# Patient Record
Sex: Male | Born: 1935 | Race: White | Hispanic: No | Marital: Married | Smoking: Former smoker
Health system: Southern US, Community
[De-identification: ages and names within clinical notes are randomized; demographics above are authoritative.]

## PROBLEM LIST (undated history)

## (undated) DIAGNOSIS — I10 Essential (primary) hypertension: Secondary | ICD-10-CM

## (undated) DIAGNOSIS — E119 Type 2 diabetes mellitus without complications: Secondary | ICD-10-CM

## (undated) DIAGNOSIS — J449 Chronic obstructive pulmonary disease, unspecified: Secondary | ICD-10-CM

---

## 2012-05-22 ENCOUNTER — Encounter (HOSPITAL_COMMUNITY): Payer: Self-pay | Admitting: Emergency Medicine

## 2012-05-22 ENCOUNTER — Inpatient Hospital Stay (HOSPITAL_COMMUNITY)
Admission: EM | Admit: 2012-05-22 | Discharge: 2012-05-31 | DRG: 871 | Disposition: A | Discharge status: Skilled Nursing Facility | Payer: Medicare Other | Attending: Internal Medicine | Admitting: Internal Medicine

## 2012-05-22 ENCOUNTER — Emergency Department (HOSPITAL_COMMUNITY): Payer: Medicare Other

## 2012-05-22 DIAGNOSIS — R238 Other skin changes: Secondary | ICD-10-CM | POA: Diagnosis not present

## 2012-05-22 DIAGNOSIS — J9819 Other pulmonary collapse: Secondary | ICD-10-CM | POA: Diagnosis present

## 2012-05-22 DIAGNOSIS — E785 Hyperlipidemia, unspecified: Secondary | ICD-10-CM | POA: Diagnosis present

## 2012-05-22 DIAGNOSIS — E872 Acidosis, unspecified: Secondary | ICD-10-CM | POA: Diagnosis present

## 2012-05-22 DIAGNOSIS — I872 Venous insufficiency (chronic) (peripheral): Secondary | ICD-10-CM | POA: Diagnosis present

## 2012-05-22 DIAGNOSIS — I440 Atrioventricular block, first degree: Secondary | ICD-10-CM | POA: Diagnosis present

## 2012-05-22 DIAGNOSIS — I442 Atrioventricular block, complete: Secondary | ICD-10-CM | POA: Diagnosis present

## 2012-05-22 DIAGNOSIS — N390 Urinary tract infection, site not specified: Secondary | ICD-10-CM | POA: Diagnosis present

## 2012-05-22 DIAGNOSIS — G4733 Obstructive sleep apnea (adult) (pediatric): Secondary | ICD-10-CM | POA: Diagnosis present

## 2012-05-22 DIAGNOSIS — I10 Essential (primary) hypertension: Secondary | ICD-10-CM | POA: Diagnosis present

## 2012-05-22 DIAGNOSIS — L0390 Cellulitis, unspecified: Secondary | ICD-10-CM

## 2012-05-22 DIAGNOSIS — L03119 Cellulitis of unspecified part of limb: Secondary | ICD-10-CM | POA: Diagnosis present

## 2012-05-22 DIAGNOSIS — N289 Disorder of kidney and ureter, unspecified: Secondary | ICD-10-CM

## 2012-05-22 DIAGNOSIS — Z87891 Personal history of nicotine dependence: Secondary | ICD-10-CM

## 2012-05-22 DIAGNOSIS — D72829 Elevated white blood cell count, unspecified: Secondary | ICD-10-CM | POA: Diagnosis present

## 2012-05-22 DIAGNOSIS — R0902 Hypoxemia: Secondary | ICD-10-CM

## 2012-05-22 DIAGNOSIS — G8929 Other chronic pain: Secondary | ICD-10-CM | POA: Diagnosis present

## 2012-05-22 DIAGNOSIS — I878 Other specified disorders of veins: Secondary | ICD-10-CM | POA: Diagnosis present

## 2012-05-22 DIAGNOSIS — N179 Acute kidney failure, unspecified: Secondary | ICD-10-CM | POA: Diagnosis present

## 2012-05-22 DIAGNOSIS — J9600 Acute respiratory failure, unspecified whether with hypoxia or hypercapnia: Secondary | ICD-10-CM | POA: Diagnosis present

## 2012-05-22 DIAGNOSIS — Z6841 Body Mass Index (BMI) 40.0 and over, adult: Secondary | ICD-10-CM

## 2012-05-22 DIAGNOSIS — E1165 Type 2 diabetes mellitus with hyperglycemia: Secondary | ICD-10-CM

## 2012-05-22 DIAGNOSIS — IMO0001 Reserved for inherently not codable concepts without codable children: Secondary | ICD-10-CM | POA: Diagnosis present

## 2012-05-22 DIAGNOSIS — N170 Acute kidney failure with tubular necrosis: Secondary | ICD-10-CM | POA: Diagnosis present

## 2012-05-22 DIAGNOSIS — J9601 Acute respiratory failure with hypoxia: Secondary | ICD-10-CM | POA: Diagnosis present

## 2012-05-22 DIAGNOSIS — I119 Hypertensive heart disease without heart failure: Secondary | ICD-10-CM | POA: Diagnosis present

## 2012-05-22 DIAGNOSIS — L02419 Cutaneous abscess of limb, unspecified: Secondary | ICD-10-CM | POA: Diagnosis present

## 2012-05-22 DIAGNOSIS — A419 Sepsis, unspecified organism: Principal | ICD-10-CM | POA: Diagnosis present

## 2012-05-22 DIAGNOSIS — Z79899 Other long term (current) drug therapy: Secondary | ICD-10-CM

## 2012-05-22 DIAGNOSIS — J189 Pneumonia, unspecified organism: Secondary | ICD-10-CM | POA: Diagnosis present

## 2012-05-22 DIAGNOSIS — IMO0002 Reserved for concepts with insufficient information to code with codable children: Secondary | ICD-10-CM | POA: Diagnosis present

## 2012-05-22 HISTORY — DX: Type 2 diabetes mellitus without complications: E11.9

## 2012-05-22 HISTORY — DX: Essential (primary) hypertension: I10

## 2012-05-22 HISTORY — DX: Chronic obstructive pulmonary disease, unspecified: J44.9

## 2012-05-22 LAB — URINALYSIS, ROUTINE W REFLEX MICROSCOPIC
Glucose, UA: NEGATIVE mg/dL
Nitrite: NEGATIVE
Protein, ur: 30 mg/dL — AB
Urobilinogen, UA: 1 mg/dL (ref 0.0–1.0)

## 2012-05-22 LAB — CBC WITH DIFFERENTIAL/PLATELET
Basophils Absolute: 0 10*3/uL (ref 0.0–0.1)
Basophils Relative: 0 % (ref 0–1)
Eosinophils Absolute: 0 10*3/uL (ref 0.0–0.7)
Hemoglobin: 12.4 g/dL — ABNORMAL LOW (ref 13.0–17.0)
Lymphocytes Relative: 6 % — ABNORMAL LOW (ref 12–46)
MCH: 31.8 pg (ref 26.0–34.0)
MCHC: 34.9 g/dL (ref 30.0–36.0)
Monocytes Absolute: 1.8 10*3/uL — ABNORMAL HIGH (ref 0.1–1.0)
Neutrophils Relative %: 88 % — ABNORMAL HIGH (ref 43–77)
Platelets: 350 10*3/uL (ref 150–400)
WBC Morphology: INCREASED

## 2012-05-22 LAB — POCT I-STAT, CHEM 8
BUN: 40 mg/dL — ABNORMAL HIGH (ref 6–23)
Calcium, Ion: 1 mmol/L — ABNORMAL LOW (ref 1.13–1.30)
HCT: 38 % — ABNORMAL LOW (ref 39.0–52.0)
Hemoglobin: 12.9 g/dL — ABNORMAL LOW (ref 13.0–17.0)
Sodium: 136 mEq/L (ref 135–145)
TCO2: 22 mmol/L (ref 0–100)

## 2012-05-22 LAB — URINE MICROSCOPIC-ADD ON

## 2012-05-22 LAB — LACTIC ACID, PLASMA: Lactic Acid, Venous: 4.2 mmol/L — ABNORMAL HIGH (ref 0.5–2.2)

## 2012-05-22 LAB — TROPONIN I: Troponin I: <0.3 ng/mL (ref <0.30)

## 2012-05-22 LAB — PROTIME-INR: Prothrombin Time: 15.1 seconds (ref 11.6–15.2)

## 2012-05-22 MED ORDER — ALBUTEROL SULFATE (5 MG/ML) 0.5% IN NEBU
5.0000 mg | INHALATION_SOLUTION | Freq: Once | RESPIRATORY_TRACT | Status: AC
  Administered 2012-05-22: 5 mg via RESPIRATORY_TRACT
  Filled 2012-05-22: qty 1

## 2012-05-22 MED ORDER — HEPARIN (PORCINE) IN NACL 100-0.45 UNIT/ML-% IJ SOLN
2450.0000 [IU]/h | INTRAMUSCULAR | Status: DC
  Administered 2012-05-22: 1750 [IU]/h via INTRAVENOUS
  Administered 2012-05-23 (×2): 2450 [IU]/h via INTRAVENOUS
  Administered 2012-05-23: 1750 [IU]/h via INTRAVENOUS
  Filled 2012-05-22 (×4): qty 250

## 2012-05-22 MED ORDER — WHITE PETROLATUM GEL
Status: AC
  Administered 2012-05-22: 1
  Filled 2012-05-22: qty 5

## 2012-05-22 MED ORDER — HYDROMORPHONE HCL PF 1 MG/ML IJ SOLN
1.0000 mg | Freq: Once | INTRAMUSCULAR | Status: AC
  Administered 2012-05-22: 1 mg via INTRAVENOUS
  Filled 2012-05-22: qty 1

## 2012-05-22 MED ORDER — PIPERACILLIN-TAZOBACTAM 3.375 G IVPB
3.3750 g | Freq: Once | INTRAVENOUS | Status: AC
  Administered 2012-05-22: 3.375 g via INTRAVENOUS
  Filled 2012-05-22: qty 50

## 2012-05-22 MED ORDER — FENTANYL CITRATE 0.05 MG/ML IJ SOLN
50.0000 ug | Freq: Once | INTRAMUSCULAR | Status: AC
  Administered 2012-05-22: 50 ug via INTRAVENOUS
  Filled 2012-05-22: qty 2

## 2012-05-22 MED ORDER — VANCOMYCIN HCL IN DEXTROSE 1-5 GM/200ML-% IV SOLN
1000.0000 mg | Freq: Once | INTRAVENOUS | Status: AC
  Administered 2012-05-22: 1000 mg via INTRAVENOUS
  Filled 2012-05-22: qty 200

## 2012-05-22 MED ORDER — SODIUM CHLORIDE 0.9 % IV SOLN
INTRAVENOUS | Status: AC
  Administered 2012-05-22: 23:00:00 via INTRAVENOUS

## 2012-05-22 MED ORDER — HEPARIN BOLUS VIA INFUSION
3200.0000 [IU] | Freq: Once | INTRAVENOUS | Status: AC
  Administered 2012-05-22: 3200 [IU] via INTRAVENOUS

## 2012-05-22 MED ORDER — SODIUM CHLORIDE 0.9 % IV BOLUS (SEPSIS)
500.0000 mL | Freq: Once | INTRAVENOUS | Status: AC
  Administered 2012-05-22: 500 mL via INTRAVENOUS

## 2012-05-22 NOTE — ED Notes (Addendum)
EMS reported patient has experienced sob, weakness for 2 weeks, nocturnal dypnea,,+orthopnea, fever, cyanotic on scene, skin hot & diaphoretic. Treated with oxygen.  Now morbid obese patient skin pale, hot & dry. Tachypnea with sightless exertion. Lower extremities left leg is leathery,  red, hot with ulcer noted. Right foot is cool.

## 2012-05-22 NOTE — ED Notes (Signed)
MD at bedside. 

## 2012-05-22 NOTE — ED Provider Notes (Signed)
History     CSN: [id number]  Arrival date & time 05/22/12  1827   First MD Initiated Contact with Patient 05/22/12 1833      Chief Complaint  Patient presents with  . Shortness of Breath    (Consider location/radiation/quality/duration/timing/severity/associated sxs/prior treatment) Patient is a 78 y.o. male presenting with shortness of breath. The history is provided by the patient.  Shortness of Breath  Associated symptoms include a fever and shortness of breath. Pertinent negatives include no chest pain.   patient has shortness of breath for the last week. He states that he has had difficulty walking. He's had cough without much sputum production. He has had chills with fevers. He states his pain is rear-ended from sitting too long. He has swelling in his left lower leg. He states it is chronically abnormal but is more red now than previously. No dysuria. No nausea vomiting or diarrhea.  History reviewed. No pertinent past medical history.  History reviewed. No pertinent past surgical history.  History reviewed. No pertinent family history.  History  Substance Use Topics  . Smoking status: Not on file  . Smokeless tobacco: Not on file  . Alcohol Use: Not on file      Review of Systems  Constitutional: Positive for fever and fatigue. Negative for activity change and appetite change.  HENT: Negative for neck stiffness.   Eyes: Negative for pain.  Respiratory: Positive for chest tightness and shortness of breath.   Cardiovascular: Negative for chest pain and leg swelling.  Gastrointestinal: Negative for nausea, vomiting, abdominal pain and diarrhea.  Genitourinary: Negative for flank pain.  Musculoskeletal: Negative for back pain.  Skin: Positive for color change. Negative for rash.  Neurological: Negative for weakness, numbness and headaches.  Psychiatric/Behavioral: Negative for behavioral problems.    Allergies  Alka-seltzer heartburn; Citric acid; Potassium  bicarbonate; and Sodium bicarbonate  Home Medications   Current Outpatient Rx  Name  Route  Sig  Dispense  Refill  . ATORVASTATIN CALCIUM 40 MG PO TABS   Oral   Take 40 mg by mouth daily.         Marland Kitchen GLIPIZIDE ER 10 MG PO TB24   Oral   Take 10 mg by mouth 2 (two) times daily.         Marland Kitchen HYDROCHLOROTHIAZIDE 25 MG PO TABS   Oral   Take 25 mg by mouth daily.         Marland Kitchen LISINOPRIL 20 MG PO TABS   Oral   Take 20 mg by mouth daily.         . MELOXICAM 15 MG PO TABS   Oral   Take 15 mg by mouth daily.         Marland Kitchen METFORMIN HCL 1000 MG PO TABS   Oral   Take 1,000 mg by mouth 2 (two) times daily with a meal.         . METOPROLOL TARTRATE 50 MG PO TABS   Oral   Take 25 mg by mouth 2 (two) times daily.         . ADULT MULTIVITAMIN W/MINERALS CH   Oral   Take 1 tablet by mouth daily.         . OXYCODONE-ACETAMINOPHEN 10-325 MG PO TABS   Oral   Take 1 tablet by mouth every 4 (four) hours as needed. pain         . POTASSIUM CHLORIDE CRYS ER 10 MEQ PO TBCR   Oral   Take  10 mEq by mouth 2 (two) times daily.         Marland Kitchen VITAMIN E 400 UNITS PO CAPS   Oral   Take 400 Units by mouth daily.           BP 139/34  Pulse 57  Temp 98.2 F (36.8 C) (Oral)  Resp 23  Ht 5\' 9"  (1.753 m)  Wt 300 lb (136.079 kg)  BMI 44.30 kg/m2  SpO2 98%  Physical Exam  Nursing note and vitals reviewed. Constitutional: He is oriented to person, place, and time. He appears well-developed and well-nourished.  HENT:  Head: Normocephalic and atraumatic.  Eyes: EOM are normal. Pupils are equal, round, and reactive to light.  Neck: Normal range of motion. Neck supple.  Cardiovascular: Normal rate, regular rhythm and normal heart sounds.   No murmur heard. Pulmonary/Chest: Effort normal.       Tachypnea. No Rales or wheezing  Abdominal: Soft. Bowel sounds are normal. He exhibits no distension and no mass. There is no tenderness. There is no rebound and no guarding.       Patient is  obese  Musculoskeletal: Normal range of motion. He exhibits edema.       Left lower leg has area of chronic venous insufficiency is now rather more red and warmer than the contralateral.  Neurological: He is alert and oriented to person, place, and time. No cranial nerve deficit.  Skin: Skin is warm and dry.  Psychiatric: He has a normal mood and affect.    ED Course  Procedures (including critical care time)  Labs Reviewed  CBC WITH DIFFERENTIAL - Abnormal; Notable for the following:    WBC 30.2 (*)     RBC 3.90 (*)     Hemoglobin 12.4 (*)     HCT 35.5 (*)     Neutrophils Relative 88 (*)     Lymphocytes Relative 6 (*)     Neutro Abs 26.6 (*)     Monocytes Absolute 1.8 (*)     All other components within normal limits  POCT I-STAT, CHEM 8 - Abnormal; Notable for the following:    BUN 40 (*)     Creatinine, Ser 1.90 (*)     Glucose, Bld 232 (*)     Calcium, Ion 1.00 (*)     Hemoglobin 12.9 (*)     HCT 38.0 (*)     All other components within normal limits  URINALYSIS, ROUTINE W REFLEX MICROSCOPIC - Abnormal; Notable for the following:    Color, Urine AMBER (*)  BIOCHEMICALS MAY BE AFFECTED BY COLOR   APPearance CLOUDY (*)     Hgb urine dipstick LARGE (*)     Bilirubin Urine SMALL (*)     Ketones, ur TRACE (*)     Protein, ur 30 (*)     Leukocytes, UA SMALL (*)     All other components within normal limits  LACTIC ACID, PLASMA - Abnormal; Notable for the following:    Lactic Acid, Venous 4.2 (*)     All other components within normal limits  URINE MICROSCOPIC-ADD ON - Abnormal; Notable for the following:    Bacteria, UA MANY (*)     Casts GRANULAR CAST (*)     All other components within normal limits  TROPONIN I  PROTIME-INR  APTT  CULTURE, BLOOD (ROUTINE X 2)  CULTURE, BLOOD (ROUTINE X 2)  HEPARIN LEVEL (UNFRACTIONATED)  CBC  URINE CULTURE   Dg Chest Port 1 View  05/22/2012  *  RADIOLOGY REPORT*  Clinical Data: Mid chest pain, shortness of breath, hypertension,  diabetes  PORTABLE CHEST - 1 VIEW  Comparison: Portable exam 1918 hours compared to 11/17/2008  Findings: Exclusion of costophrenic angles especially on the left. Upper normal heart size. Mediastinal contours and pulmonary vascularity normal. Question minimal right basilar atelectasis. Upper lungs clear. No gross pleural effusion or pneumothorax.  IMPRESSION: No definite acute abnormalities though the costophrenic angles particularly on the left are partially excluded. Consider follow-up upright PA and lateral chest radiographs if clinically indicated to better evaluate the lung bases.   Original Report Authenticated By: Ulyses Southward, M.D.      1. Urosepsis   2. Complete heart block   3. Hypoxia   4. Renal insufficiency   5. Cellulitis      Date: 05/22/2012  Rate: 62  Rhythm: complete heart block  QRS Axis: normal  Intervals: normal  ST/T Wave abnormalities: nonspecific ST/T changes  Conduction Disutrbances:right bundle branch block  Narrative Interpretation:   Old EKG Reviewed: none available   Date: 05/22/2012  Rate: 59  Rhythm: 2:1 AV block  QRS Axis: normal  Intervals: normal  ST/T Wave abnormalities: normal  Conduction Disutrbances:right bundle branch block  Narrative Interpretation: complete block is now 2:1  Old EKG Reviewed: changes noted  CRITICAL CARE Performed by: Billee Cashing   Total critical care time: 35  Critical care time was exclusive of separately billable procedures and treating other patients.  Critical care was necessary to treat or prevent imminent or life-threatening deterioration.  Critical care was time spent personally by me on the following activities: development of treatment plan with patient and/or surrogate as well as nursing, discussions with consultants, evaluation of patient's response to treatment, examination of patient, obtaining history from patient or surrogate, ordering and performing treatments and interventions, ordering and  review of laboratory studies, ordering and review of radiographic studies, pulse oximetry and re-evaluation of patient's condition.  MDM  Patient presents with generalized weakness shortness of breath. He is hypoxic on room air with left lower extremity redness and erythema. X-ray does not show pneumonia. At this point I am treating him as a presumed pulmonary embolism. I cannot get a CT angio at this time do to renal insufficiency. He was started on heparin empirically. Patient was also found to have complete heart block and then later 2:1 AV block. No known history of this. After discussion with cardiology the patient will be transferred to [facility] cone.  and the patient's fever and was found to have urinary tract infection. He was started on Zosyn and vancomycin to cover urine and cellulitis. His lactic acid is elevated, however his blood pressure has been overall maintained. He'll be admitted to the ICU at [facility].      Juliet Rude. Rubin Payor, MD 05/22/12 [phone]

## 2012-05-22 NOTE — ED Notes (Signed)
Attempted to give report. Receiving RN busy. 

## 2012-05-22 NOTE — ED Notes (Signed)
EKG printed and given to EDP Pickering for review

## 2012-05-22 NOTE — ED Notes (Signed)
EDP at bedside  

## 2012-05-22 NOTE — Progress Notes (Signed)
ANTICOAGULATION CONSULT NOTE - Initial Consult  Pharmacy Consult for Heparin Indication: pulmonary embolus  Allergies  Allergen Reactions  . Alka-Seltzer Heartburn (Sodium Bicarbonate-Citric Acid)   . Citric Acid     All the ingredients in alka seltzer gold  . Potassium Bicarbonate     All the ingredients in alka seltzer gold  . Sodium Bicarbonate     All the ingredients in alka seltzer gold    Patient Measurements: Height: 5\' 9"  (175.3 cm) Weight: 300 lb (136.079 kg) IBW/kg (Calculated) : 70.7  Heparin Dosing Weight: 102  Vital Signs: Temp: 100.8 F (38.2 C) (11/17 1935) Temp src: Oral (11/17 1935) BP: 145/35 mmHg (11/17 2000) Pulse Rate: 71  (11/17 2000)  Labs:  Basename 05/22/12 2033 05/22/12 1948 05/22/12 1933  HGB -- 12.9* 12.4*  HCT -- 38.0* 35.5*  PLT -- -- 350  APTT 35 -- --  LABPROT 15.1 -- --  INR 1.21 -- --  HEPARINUNFRC -- -- --  CREATININE -- 1.90* --  CKTOTAL -- -- --  CKMB -- -- --  TROPONINI -- -- <0.30    Estimated Creatinine Clearance: 43.2 ml/min (by C-G formula based on Cr of 1.9).   Medical History: History reviewed. No pertinent past medical history.  Medications:  Scheduled:    . [COMPLETED] albuterol  5 mg Nebulization Once  . [COMPLETED] fentaNYL  50 mcg Intravenous Once  .  HYDROmorphone (DILAUDID) injection  1 mg Intravenous Once  . vancomycin  1,000 mg Intravenous Once   Infusions:   PRN:   Assessment: 81 YOM admitted 11/17 w/ sob. Pharmacy to dose Heparin for PE Baseline anticoagulation and CBC labs reviewed.    Goal of Therapy:  Heparin level 0.3-0.7 units/ml Monitor platelets by anticoagulation protocol: Yes   Plan:  Heparin Bolus 3200 units Heparin infusion 1750 units/hr Heparin level 8 hr after start of infusion Daily HL and CBC  Gwen Her PharmD  [phone] 05/22/2012 9:19 PM

## 2012-05-22 NOTE — ED Notes (Signed)
OZH:[id number]<HQ> Expected date:<BR> Expected time:<BR> Means of arrival:<BR> Comments:<BR> SOB

## 2012-05-23 ENCOUNTER — Inpatient Hospital Stay (HOSPITAL_COMMUNITY): Payer: Medicare Other

## 2012-05-23 DIAGNOSIS — R0902 Hypoxemia: Secondary | ICD-10-CM

## 2012-05-23 DIAGNOSIS — L0291 Cutaneous abscess, unspecified: Secondary | ICD-10-CM

## 2012-05-23 DIAGNOSIS — I442 Atrioventricular block, complete: Secondary | ICD-10-CM

## 2012-05-23 DIAGNOSIS — A419 Sepsis, unspecified organism: Secondary | ICD-10-CM

## 2012-05-23 DIAGNOSIS — N390 Urinary tract infection, site not specified: Secondary | ICD-10-CM

## 2012-05-23 DIAGNOSIS — N179 Acute kidney failure, unspecified: Secondary | ICD-10-CM

## 2012-05-23 DIAGNOSIS — I441 Atrioventricular block, second degree: Secondary | ICD-10-CM

## 2012-05-23 DIAGNOSIS — R0602 Shortness of breath: Secondary | ICD-10-CM

## 2012-05-23 DIAGNOSIS — M7989 Other specified soft tissue disorders: Secondary | ICD-10-CM

## 2012-05-23 LAB — GLUCOSE, CAPILLARY
Glucose-Capillary: 243 mg/dL — ABNORMAL HIGH (ref 70–99)
Glucose-Capillary: 220 mg/dL — ABNORMAL HIGH (ref 70–99)

## 2012-05-23 LAB — BASIC METABOLIC PANEL
Chloride: 98 mEq/L (ref 96–112)
GFR calc Af Amer: 29 mL/min — ABNORMAL LOW (ref >90)
GFR calc non Af Amer: 25 mL/min — ABNORMAL LOW (ref >90)
Glucose, Bld: 225 mg/dL — ABNORMAL HIGH (ref 70–99)
Potassium: 3.6 mEq/L (ref 3.5–5.1)
Sodium: 136 mEq/L (ref 135–145)

## 2012-05-23 LAB — MRSA PCR SCREENING: MRSA by PCR: NEGATIVE

## 2012-05-23 LAB — POCT I-STAT 3, ART BLOOD GAS (G3+)
Acid-base deficit: 3 mmol/L — ABNORMAL HIGH (ref 0.0–2.0)
Bicarbonate: 21.5 mEq/L (ref 20.0–24.0)
O2 Saturation: 96 %
TCO2: 23 mmol/L (ref 0–100)

## 2012-05-23 LAB — HEPATIC FUNCTION PANEL
Bilirubin, Direct: 0.2 mg/dL (ref 0.0–0.3)
Indirect Bilirubin: 0.2 mg/dL — ABNORMAL LOW (ref 0.3–0.9)
Total Bilirubin: 0.4 mg/dL (ref 0.3–1.2)

## 2012-05-23 LAB — HEMOGLOBIN A1C
Hgb A1c MFr Bld: 6.7 % — ABNORMAL HIGH (ref <5.7)
Mean Plasma Glucose: 146 mg/dL — ABNORMAL HIGH (ref <117)

## 2012-05-23 LAB — HEPARIN LEVEL (UNFRACTIONATED)
Heparin Unfractionated: <0.1 IU/mL — ABNORMAL LOW (ref 0.30–0.70)
Heparin Unfractionated: 0.18 IU/mL — ABNORMAL LOW (ref 0.30–0.70)

## 2012-05-23 LAB — TSH: TSH: 0.525 u[IU]/mL (ref 0.350–4.500)

## 2012-05-23 LAB — CBC
MCH: 30.8 pg (ref 26.0–34.0)
Platelets: 295 10*3/uL (ref 150–400)
RBC: 3.64 MIL/uL — ABNORMAL LOW (ref 4.22–5.81)

## 2012-05-23 MED ORDER — SODIUM CHLORIDE 0.9 % IV SOLN
250.0000 mL | INTRAVENOUS | Status: DC | PRN
  Administered 2012-05-29: 250 mL via INTRAVENOUS

## 2012-05-23 MED ORDER — INFLUENZA VIRUS VACC SPLIT PF IM SUSP
0.5000 mL | INTRAMUSCULAR | Status: AC
  Filled 2012-05-23: qty 0.5

## 2012-05-23 MED ORDER — FAMOTIDINE IN NACL 20-0.9 MG/50ML-% IV SOLN
20.0000 mg | Freq: Every day | INTRAVENOUS | Status: DC
  Administered 2012-05-23 – 2012-05-25 (×4): 20 mg via INTRAVENOUS
  Filled 2012-05-23 (×7): qty 50

## 2012-05-23 MED ORDER — PIPERACILLIN-TAZOBACTAM 3.375 G IVPB
3.3750 g | Freq: Three times a day (TID) | INTRAVENOUS | Status: DC
  Administered 2012-05-23 – 2012-05-25 (×7): 3.375 g via INTRAVENOUS
  Filled 2012-05-23 (×8): qty 50

## 2012-05-23 MED ORDER — SODIUM CHLORIDE 0.9 % IV SOLN: INTRAVENOUS | Status: DC

## 2012-05-23 MED ORDER — HEPARIN SODIUM (PORCINE) 5000 UNIT/ML IJ SOLN
5000.0000 [IU] | Freq: Three times a day (TID) | INTRAMUSCULAR | Status: DC
  Administered 2012-05-24 – 2012-05-31 (×21): 5000 [IU] via SUBCUTANEOUS
  Filled 2012-05-23 (×28): qty 1

## 2012-05-23 MED ORDER — PERFLUTREN LIPID MICROSPHERE
INTRAVENOUS | Status: AC
  Administered 2012-05-23: 1.5 mL
  Filled 2012-05-23: qty 10

## 2012-05-23 MED ORDER — IPRATROPIUM BROMIDE 0.02 % IN SOLN: 0.5000 mg | RESPIRATORY_TRACT | Status: DC | PRN

## 2012-05-23 MED ORDER — INSULIN ASPART 100 UNIT/ML ~~LOC~~ SOLN
2.0000 [IU] | SUBCUTANEOUS | Status: DC
  Administered 2012-05-23 (×2): 4 [IU] via SUBCUTANEOUS
  Administered 2012-05-23: 6 [IU] via SUBCUTANEOUS
  Administered 2012-05-23 – 2012-05-24 (×4): 2 [IU] via SUBCUTANEOUS
  Administered 2012-05-24: 4 [IU] via SUBCUTANEOUS
  Administered 2012-05-24: 2 [IU] via SUBCUTANEOUS

## 2012-05-23 MED ORDER — VANCOMYCIN HCL 1000 MG IV SOLR
1250.0000 mg | INTRAVENOUS | Status: DC
  Administered 2012-05-23: 1250 mg via INTRAVENOUS
  Filled 2012-05-23: qty 1250

## 2012-05-23 MED ORDER — ALBUTEROL SULFATE (5 MG/ML) 0.5% IN NEBU: 2.5000 mg | INHALATION_SOLUTION | RESPIRATORY_TRACT | Status: DC | PRN

## 2012-05-23 MED ORDER — PNEUMOCOCCAL VAC POLYVALENT 25 MCG/0.5ML IJ INJ
0.5000 mL | INJECTION | INTRAMUSCULAR | Status: AC
  Filled 2012-05-23: qty 0.5

## 2012-05-23 MED ORDER — SODIUM CHLORIDE 0.9 % IV BOLUS (SEPSIS)
1000.0000 mL | Freq: Once | INTRAVENOUS | Status: AC
  Administered 2012-05-23: 1000 mL via INTRAVENOUS

## 2012-05-23 MED ORDER — ACETAMINOPHEN 650 MG RE SUPP
325.0000 mg | RECTAL | Status: DC | PRN
  Administered 2012-05-23: 325 mg via RECTAL
  Filled 2012-05-23: qty 1

## 2012-05-23 MED ORDER — ALBUTEROL SULFATE HFA 108 (90 BASE) MCG/ACT IN AERS
4.0000 | INHALATION_SPRAY | RESPIRATORY_TRACT | Status: DC | PRN
  Administered 2012-05-23: 4 via RESPIRATORY_TRACT
  Filled 2012-05-23: qty 6.7

## 2012-05-23 MED ORDER — HEPARIN BOLUS VIA INFUSION
3000.0000 [IU] | Freq: Once | INTRAVENOUS | Status: AC
  Administered 2012-05-23: 3000 [IU] via INTRAVENOUS
  Filled 2012-05-23: qty 3000

## 2012-05-23 MED ORDER — VANCOMYCIN HCL 1000 MG IV SOLR
1500.0000 mg | INTRAVENOUS | Status: DC
  Administered 2012-05-24 – 2012-05-26 (×3): 1500 mg via INTRAVENOUS
  Filled 2012-05-23 (×3): qty 1500

## 2012-05-23 NOTE — ED Notes (Signed)
Pt denied chest pain. Pt stated pain is mostly in buttocks from sitting too long on the stretcher.

## 2012-05-23 NOTE — H&P (Signed)
Name: Luis Carrillo MRN: [id number] DOB: 12/18/1930    LOS: 1  PULMONARY / CRITICAL CARE MEDICINE  HPI:  78 years old morbid obese male with PMH relevant for DM, HTN, dyslipidemia and chronic pain on narcotics. Presents with two weeks of worsening SOB, productive cough and more recently fever and chills. Over the last couple of days developed LLE erythema and swelling. Does not recall trauma but there is a healing wound in the LLE. At the time of my exam awake, alert, oriented x3 and hemodynamically stable. At admission found to be briefly on complete heart block but now 2:1 AV block. Cardiology was consulted. UA found consistent with UTI. Denies N/V/D, urinary symptoms. No CP.  Current Status:   Vital Signs: Temp:  [97.6 F (36.4 C)-100.8 F (38.2 C)] 97.6 F (36.4 C) (11/18 0805) Pulse Rate:  [41-74] 69  (11/18 0700) Resp:  [17-30] 28  (11/18 0700) BP: (102-156)/(20-115) 106/66 mmHg (11/18 0700) SpO2:  [87 %-99 %] 94 % (11/18 0700) Weight:  [136.079 kg (300 lb)-148.1 kg (326 lb 8 oz)] 148.1 kg (326 lb 8 oz) (11/18 0230)  Physical Examination: General:  Pleasant male patient in mild respiratory distress Neuro:  Awake, alert, oriented x 3, nonfocal HEENT:  PERRL, pink conjunctivae, moist membranes Neck:  Supple, no JVD   Cardiovascular:  RRR, no M/R/G Lungs:  Bilateral diminished air entry, no W/R/R Abdomen:  Soft, nontender, nondistended, bowel sounds present Musculoskeletal:  Moves all extremities, pedal edema present Skin:  LLE erythema and increased temperature, 1 cm superficial wound healing.  ASSESSMENT AND PLAN 1) Sepsis 2) Possible pneumonia 3) LLE cellulitis, will rule out DVT. 4) UTI 5) Brief period of complete heart block, now 2:1 AV block 6) HTN 7) DM 8) Dyslipidemia 9) Morbid obesity.  PULMONARY No results found for this basename: PHART:5,PCO2:5,PCO2ART:5,PO2ART:5,HCO3:5,O2SAT:5 in the last 168 hours Ventilator Settings:   CXR:  Blurring of the left  hemidiaphragm suggestive of LLL pneumonia.  A:  1) Possible LLL pneumonia P:   1) Will treat with Zosyn and Vancomycin  CARDIOVASCULAR  Lab 05/23/12 0505 05/22/12 2033 05/22/12 1933  TROPONINI -- -- <0.30  LATICACIDVEN 2.4* 4.2* --  PROBNP -- -- --   ECG:  Last EKG on NSR, RBBB Lines: Peripheral lines  A:  1) Sepsis 2) Lactic acidosis 3) Brief episode of complete heart block and 2:1 AV block 4) LLE cellulitis and edema P:  1) KVO IVF and hold lasix given renal function. 2) Antibiotics as above. 3) We will hold antihypertensive medications. 4) LE venous doppler to rule out DVT pending. 5) Will continue heparin drip for now.  RENAL  Lab 05/23/12 0505 05/22/12 1948  NA 136 136  K 3.6 4.4  CL 98 103  CO2 20 --  BUN 36* 40*  CREATININE 2.34* 1.90*  CALCIUM 8.0* --  MG -- --  PHOS -- --   Intake/Output      11/17 0701 - 11/18 0700 11/18 0701 - 11/19 0700   P.O. 360    I.V. (mL/kg) 390 (2.6) 22.5 (0.2)   IV Piggyback 1000 178.5   Total Intake(mL/kg) 1750 (11.8) 201 (1.4)   Urine (mL/kg/hr) 200 (0.1) 50   Total Output 200 50   Net +1550 +151        Urine Occurrence 1 x      Intake/Output Summary (Last 24 hours) at 05/23/12 0927 Last data filed at 05/23/12 0737  Gross per 24 hour  Intake   1951 ml  Output  250 ml  Net   1701 ml   Foley:  05/22/12  A:   1) Acute renal failure with BUN:Cr dropping with hydration but Cr rising. P:   1) KVO IVF. 2) Monitor chemistry in am. 3) Continue to hold lasix for now.  GASTROINTESTINAL No results found for this basename: AST:5,ALT:5,ALKPHOS:5,BILITOT:5,PROT:5,ALBUMIN:5 in the last 168 hours  A:   1) No issues P:   - GI prophylaxis with pepcid  HEMATOLOGIC  Lab 05/23/12 0505 05/22/12 2033 05/22/12 1948 05/22/12 1933  HGB 11.2* -- 12.9* 12.4*  HCT 33.1* -- 38.0* 35.5*  PLT 295 -- -- 350  INR -- 1.21 -- --  APTT -- 35 -- --   A:   1) No issues  INFECTIOUS  Lab 05/23/12 0505 05/22/12 1933  WBC 28.8*  30.2*  PROCALCITON -- --   Cultures: Blood, s[putum and urine cultures ordered. Antibiotics: Zosyn and Vancomycin 05/22/12  A:   1) Sepsis - Possible pneumonia - LLE cellulitis - UTI P:   - Antibiotics as above, will narrow once Cx results  ENDOCRINE  Lab 05/23/12 0807 05/23/12 0327 05/23/12 0115  GLUCAP 189* 220* 243*   A:   1) DM P:   - Insulin sliding scale  NEUROLOGIC  A:   1) No issues  BEST PRACTICE / DISPOSITION - Level of Care: ICU - Primary Service:  PCCM - Consultants:  Cardiology - Code Status:  Full code - Diet:  Clear liquids - DVT Px:  Heparin drip - GI Px:  Pepcid - Skin Integrity:  LLE cellulitis - Social / Family:  Family updated at bedside.  Rapidly developing respiratory failure, IVF overloaded and PNA in addition to body habitus all contributing, concern for intubation is that we will not be able to get him off the ventilator.  Will attempt BiPAP first.  The patient is critically ill with multiple organ systems failure and requires high complexity decision making for assessment and support, frequent evaluation and titration of therapies, application of advanced monitoring technologies and extensive interpretation of multiple databases.   Critical Care Time devoted to patient care services described in this note is: 35 min.  Overton Mam, M.D. Pulmonary and Critical Care Medicine [facility] Pager: [phone]  05/23/2012, 9:12 AM

## 2012-05-23 NOTE — H&P (Signed)
Name: Luis Carrillo MRN: [id number] DOB: Mar 06, 1931    LOS: 1  PULMONARY / CRITICAL CARE MEDICINE  HPI:  78 years old morbid obese male with PMH relevant for DM, HTN, dyslipidemia and chronic pain on narcotics. Presents with two weeks of worsening SOB, productive cough and more recently fever and chills. Over the last couple of days developed LLE erythema and swelling. Does not recall trauma but there is a healing wound in the LLE. At the time of my exam awake, alert, oriented x3 and hemodynamically stable. At admission found to be briefly on complete heart block but now 2:1 AV block. Cardiology was consulted. UA found consistent with UTI. Denies N/V/D, urinary symptoms. No CP.  PMH: 1) HTN 2) Dyslipidemia 3) DM 4) Obesity  Prior to Admission medications   Medication Sig Start Date End Date Taking? Authorizing Provider  atorvastatin (LIPITOR) 40 MG tablet Take 40 mg by mouth daily.   Yes Historical Provider, MD  glipiZIDE (GLUCOTROL XL) 10 MG 24 hr tablet Take 10 mg by mouth 2 (two) times daily.   Yes Historical Provider, MD  hydrochlorothiazide (HYDRODIURIL) 25 MG tablet Take 25 mg by mouth daily.   Yes Historical Provider, MD  lisinopril (PRINIVIL,ZESTRIL) 20 MG tablet Take 20 mg by mouth daily.   Yes Historical Provider, MD  meloxicam (MOBIC) 15 MG tablet Take 15 mg by mouth daily.   Yes Historical Provider, MD  metFORMIN (GLUCOPHAGE) 1000 MG tablet Take 1,000 mg by mouth 2 (two) times daily with a meal.   Yes Historical Provider, MD  metoprolol (LOPRESSOR) 50 MG tablet Take 25 mg by mouth 2 (two) times daily.   Yes Historical Provider, MD  Multiple Vitamin (MULTIVITAMIN WITH MINERALS) TABS Take 1 tablet by mouth daily.   Yes Historical Provider, MD  oxyCODONE-acetaminophen (PERCOCET) 10-325 MG per tablet Take 1 tablet by mouth every 4 (four) hours as needed. pain   Yes Historical Provider, MD  potassium chloride (K-DUR,KLOR-CON) 10 MEQ tablet Take 10 mEq by mouth 2 (two) times daily.   Yes  Historical Provider, MD  vitamin E 400 UNIT capsule Take 400 Units by mouth daily.   Yes Historical Provider, MD   Allergies Allergies  Allergen Reactions  . Alka-Seltzer Heartburn (Sodium Bicarbonate-Citric Acid)   . Citric Acid     All the ingredients in alka seltzer gold  . Potassium Bicarbonate     All the ingredients in alka seltzer gold  . Sodium Bicarbonate     All the ingredients in alka seltzer gold    Family History History reviewed. No pertinent family history. Social History Former smoker of 1 pack per day for 30 years. Quit in 1980. No history of drugs or alcohol abuse.  Review Of Systems:  All systems reviewed and found negative except for what I mentioned in the HPI.   Current Status:  Vital Signs: Temp:  [98.2 F (36.8 C)-100.8 F (38.2 C)] 100.4 F (38 C) (11/18 0230) Pulse Rate:  [41-71] 52  (11/18 0330) Resp:  [17-30] 30  (11/18 0330) BP: (102-156)/(28-84) 124/34 mmHg (11/18 0330) SpO2:  [87 %-99 %] 94 % (11/18 0330) Weight:  [300 lb (136.079 kg)-326 lb 8 oz (148.1 kg)] 326 lb 8 oz (148.1 kg) (11/18 0230)  Physical Examination: General:  Pleasant male patient in mild respiratory distress Neuro:  Awake, alert, oriented x 3, nonfocal HEENT:  PERRL, pink conjunctivae, moist membranes Neck:  Supple, no JVD   Cardiovascular:  RRR, no M/R/G Lungs:  Bilateral diminished air entry,  no W/R/R Abdomen:  Soft, nontender, nondistended, bowel sounds present Musculoskeletal:  Moves all extremities, pedal edema present Skin:  LLE erythema and increased temperature, 1 cm superficial wound healing.    ASSESSMENT AND PLAN 1) Sepsis 2) Possible pneumonia 3) LLE cellulitis, will rule out DVT. 4) UTI 5) Brief period of complete heart block, now 2:1 AV block 6) HTN 7) DM 8) Dyslipidemia 9) Morbid obesity.  PULMONARY No results found for this basename: PHART:5,PCO2:5,PCO2ART:5,PO2ART:5,HCO3:5,O2SAT:5 in the last 168 hours Ventilator Settings:   CXR:   Blurring of the left hemidiaphragm suggestive of LLL pneumonia.  A:  1) Possible LLL pneumonia P:   1) Will treat with Zosyn and Vancomycin  CARDIOVASCULAR  Lab 05/22/12 2033 05/22/12 1933  TROPONINI -- <0.30  LATICACIDVEN 4.2* --  PROBNP -- --   ECG:  Last EKG on NSR, RBBB Lines: Peripheral lines  A:  1) Sepsis 2) Lactic acidosis 3) Brief episode of complete heart block and 2:1 AV block 4) LLE cellulitis and edema P:  1) Will continue IVF resuscitation 2) Antibiotics as above 3) We will hold antihypertensive medications 4) Will get LE venous doppler to rule out DVT 5) Will continue heparin drip for now.  RENAL  Lab 05/22/12 1948  NA 136  K 4.4  CL 103  CO2 --  BUN 40*  CREATININE 1.90*  CALCIUM --  MG --  PHOS --   Intake/Output      11/17 0701 - 11/18 0700   P.O. 240   I.V. (mL/kg) 55 (0.4)   IV Piggyback 1000   Total Intake(mL/kg) 1295 (8.7)   Urine (mL/kg/hr) 100 (0)   Total Output 100   Net +1195        Foley:  05/22/12  A:   1) Acute renal failure, likely pre renal P:   1) Will continue IVF's 2) Monitor chemistry in am  GASTROINTESTINAL No results found for this basename: AST:5,ALT:5,ALKPHOS:5,BILITOT:5,PROT:5,ALBUMIN:5 in the last 168 hours  A:   1) No issues P:   - GI prophylaxis with pepcid  HEMATOLOGIC  Lab 05/22/12 2033 05/22/12 1948 05/22/12 1933  HGB -- 12.9* 12.4*  HCT -- 38.0* 35.5*  PLT -- -- 350  INR 1.21 -- --  APTT 35 -- --   A:   1) No issues   INFECTIOUS  Lab 05/22/12 1933  WBC 30.2*  PROCALCITON --   Cultures: Blood, s[putum and urine cultures ordered. Antibiotics: Zosyn and Vancomycin 05/22/12  A:   1) Sepsis - Possible pneumonia - LLE cellulitis - UTI P:   - Antibiotics as above.  ENDOCRINE  Lab 05/23/12 0115  GLUCAP 243*   A:   1) DM P:   - Insulin sliding scale  NEUROLOGIC  A:   1) No issues   BEST PRACTICE / DISPOSITION - Level of Care: ICU - Primary Service:  PCCM -  Consultants:  Cardiology - Code Status:  Full code - Diet:  Clear liquids - DVT Px:  Heparin drip - GI Px:  Pepcid - Skin Integrity:  LLE cellulitis - Social / Family:  Family updated at bedside.  The patient is critically ill with multiple organ systems failure and requires high complexity decision making for assessment and support, frequent evaluation and titration of therapies, application of advanced monitoring technologies and extensive interpretation of multiple databases.   Critical Care Time devoted to patient care services described in this note is: 1 Hour  Overton Mam, M.D. Pulmonary and Critical Care Medicine  HealthCare Pager: [phone])  [phone]  05/23/2012, 3:59 AM

## 2012-05-23 NOTE — Care Management Note (Signed)
    Page 1 of 1   05/23/2012     12:07:34 PM   CARE MANAGEMENT NOTE 05/23/2012  Patient:  STEVAN, BAKKEN   Account Number:  [id number]  Date Initiated:  05/23/2012  Documentation initiated by:  Junius Creamer  Subjective/Objective Assessment:   adm w pneumonia     Action/Plan:   lives w wife   Anticipated DC Date:     Anticipated DC Plan:        DC Planning Services  CM consult      Choice offered to / List presented to:             Status of service:   Medicare Important Message given?   (If response is "NO", the following Medicare IM given date fields will be blank) Date Medicare IM given:   Date Additional Medicare IM given:    Discharge Disposition:    Per UR Regulation:  Reviewed for med. necessity/level of care/duration of stay  If discussed at Long Length of Stay Meetings, dates discussed:    Comments:  11/18 12n debbie Latronda Spink rn,bsn [phone]

## 2012-05-23 NOTE — Progress Notes (Signed)
ANTICOAGULATION CONSULT NOTE  Pharmacy Consult for Heparin Indication: R/O PE/DVT  Allergies  Allergen Reactions  . Alka-Seltzer Heartburn (Sodium Bicarbonate-Citric Acid)   . Citric Acid     All the ingredients in alka seltzer gold  . Potassium Bicarbonate     All the ingredients in alka seltzer gold  . Sodium Bicarbonate     All the ingredients in alka seltzer gold    Patient Measurements: Height: 5\' 9"  (175.3 cm) Weight: 326 lb 8 oz (148.1 kg) IBW/kg (Calculated) : 70.7  Heparin Dosing Weight: 102  Vital Signs: Temp: 100.4 F (38 C) (11/18 0230) Temp src: Oral (11/18 0230) BP: 127/115 mmHg (11/18 0530) Pulse Rate: 73  (11/18 0530)  Labs:  Basename 05/23/12 0505 05/22/12 2033 05/22/12 1948 05/22/12 1933  HGB 11.2* -- 12.9* --  HCT 33.1* -- 38.0* 35.5*  PLT 295 -- -- 350  APTT -- 35 -- --  LABPROT -- 15.1 -- --  INR -- 1.21 -- --  HEPARINUNFRC <0.10* -- -- --  CREATININE 2.34* -- 1.90* --  CKTOTAL -- -- -- --  CKMB -- -- -- --  TROPONINI -- -- -- <0.30    Estimated Creatinine Clearance: 36.8 ml/min (by C-G formula based on Cr of 2.34).  Assessment: 78 yo male with possible DVT/PE for Heparin  Goal of Therapy:  Heparin level 0.3-0.7 units/ml Monitor platelets by anticoagulation protocol: Yes   Plan:  Heparin 3000 units IV bolus, then increase heparin 2250 units/hr Check heparin level in 6 hours.  Geannie Risen, PharmD, BCPS 05/23/2012 6:50 AM

## 2012-05-23 NOTE — Progress Notes (Signed)
eLink Physician-Brief Progress Note Patient Name: Luis Carrillo DOB: [birth year] MRN: [id number]  Date of Service  05/23/2012   HPI/Events of Note  Fever 102; patient with cellulitis, UTI and possible PNA; ARF    eICU Interventions  Tylenol for fever, cont zosyn and vanc; renal US; urine legionella and pneumococcus antig      Etter Royall 05/23/2012, 4:55 PM

## 2012-05-23 NOTE — Progress Notes (Addendum)
ANTIBIOTIC CONSULT NOTE - INITIAL  Pharmacy Consult for vancomycin, Zosyn Indication: rule out sepsis  Allergies  Allergen Reactions  . Alka-Seltzer Heartburn (Sodium Bicarbonate-Citric Acid)   . Citric Acid     All the ingredients in alka seltzer gold  . Potassium Bicarbonate     All the ingredients in alka seltzer gold  . Sodium Bicarbonate     All the ingredients in alka seltzer gold    Patient Measurements: Height: 5\' 9"  (175.3 cm) Weight: 300 lb (136.079 kg) IBW/kg (Calculated) : 70.7   Vital Signs: Temp: 98.2 F (36.8 C) (11/17 2237) Temp src: Oral (11/17 2237) BP: 156/43 mmHg (11/18 0134) Pulse Rate: 55  (11/18 0134) Intake/Output from previous day: 11/17 0701 - 11/18 0700 In: -  Out: 100 [Urine:100] Intake/Output from this shift: Total I/O In: -  Out: 100 [Urine:100]  Labs:  [facility] 05/22/12 1948 05/22/12 1933  WBC -- 30.2*  HGB 12.9* 12.4*  PLT -- 350  LABCREA -- --  CREATININE 1.90* --   Estimated Creatinine Clearance: 43.2 ml/min (by C-G formula based on Cr of 1.9). No results found for this basename: VANCOTROUGH:2,VANCOPEAK:2,VANCORANDOM:2,GENTTROUGH:2,GENTPEAK:2,GENTRANDOM:2,TOBRATROUGH:2,TOBRAPEAK:2,TOBRARND:2,AMIKACINPEAK:2,AMIKACINTROU:2,AMIKACIN:2, in the last 72 hours   Microbiology: No results found for this or any previous visit (from the past 720 hour(s)).  Medical History: History reviewed. No pertinent past medical history.  Medications:  Scheduled:    . sodium chloride   Intravenous STAT  . [COMPLETED] albuterol  5 mg Nebulization Once  . famotidine (PEPCID) IV  20 mg Intravenous Q12H  . [COMPLETED] fentaNYL  50 mcg Intravenous Once  . [COMPLETED] heparin  3,200 Units Intravenous Once  . [COMPLETED]  HYDROmorphone (DILAUDID) injection  1 mg Intravenous Once  . piperacillin-tazobactam (ZOSYN)  IV  3.375 g Intravenous Once  . [COMPLETED] sodium chloride  500 mL Intravenous Once  . [COMPLETED] vancomycin  1,000 mg Intravenous Once   . [COMPLETED] white petrolatum       Assessment: 78 yo male transferred from [facility] with generalized weakness and shortness of breath. Patient was started on heparin for possible PE (no CT angio at this time due to renal insufficiency). Pharmacy now consulted to manage vancomycin and Zosyn for possible sepsis / pneumonia / cellulitis. Patient has already received vancomycin 1gm x 1 and Zosyn 3.375gm IV x 1.   Goal of Therapy:  Vancomycin trough 10-20 mcg/mL   Plan:  1. Zosyn 3.375gm IV Q8H (4 hr infusion) 2. Vancomycin 1.25gm IV Q24H.   Reynolds, Mellody Drown 05/23/2012,2:38 AM  Based on patients weight and renal function, will increase dose of vancomycin to 1500mg  IV q24h, will monitor renal function closely, and get steady state levels as soon as possible

## 2012-05-23 NOTE — Consult Note (Signed)
Reason for Consult: AV Block 2:1 and brief period of Complete Heart Block Referring Physician: Dr. Gwenyth Bouillon Luis Carrillo is an 78 y.o. male.  HPI: 78 yo man with Hypertension, dyslipidemia, T2DM, chronic pain on narcotics (oxycontin and oxycodone), rocky [place] spotted fever 25 years ago, bilateral knee pain, family history only of a brother with MI who comes in with cough, shortness of breath and left leg cellulitis. He tells me he is mainly limited by his bilateral knee pain and takes 1 oxycodone or oxycontin every 6 hours for pain. He's noted that coughing and SOB have been more bothersome over the last several days with eruption of left leg warmth/erythema and swelling. He just started noticing some chills/night sweats and fevers (today). He otherwise does not have current chest pain, no lightheadedness or syncope. He gets around ok at home except for knee pain.   History reviewed. No pertinent past medical history.  History reviewed. No pertinent past surgical history.  History reviewed. No pertinent family history.  Social History:  does not have a smoking history on file. He does not have any smokeless tobacco history on file. His alcohol and drug histories not on file. No tobacco/etoh or drugs  Allergies:  Allergies  Allergen Reactions  . Alka-Seltzer Heartburn (Sodium Bicarbonate-Citric Acid)   . Citric Acid     All the ingredients in alka seltzer gold  . Potassium Bicarbonate     All the ingredients in alka seltzer gold  . Sodium Bicarbonate     All the ingredients in alka seltzer gold    Medications:  I have reviewed the patient's current medications. Prior to Admission:  Prescriptions prior to admission  Medication Sig Dispense Refill  . atorvastatin (LIPITOR) 40 MG tablet Take 40 mg by mouth daily.      Marland Kitchen glipiZIDE (GLUCOTROL XL) 10 MG 24 hr tablet Take 10 mg by mouth 2 (two) times daily.      . hydrochlorothiazide (HYDRODIURIL) 25 MG tablet Take 25 mg by mouth daily.       Marland Kitchen lisinopril (PRINIVIL,ZESTRIL) 20 MG tablet Take 20 mg by mouth daily.      . meloxicam (MOBIC) 15 MG tablet Take 15 mg by mouth daily.      . metFORMIN (GLUCOPHAGE) 1000 MG tablet Take 1,000 mg by mouth 2 (two) times daily with a meal.      . metoprolol (LOPRESSOR) 50 MG tablet Take 25 mg by mouth 2 (two) times daily.      . Multiple Vitamin (MULTIVITAMIN WITH MINERALS) TABS Take 1 tablet by mouth daily.      Marland Kitchen oxyCODONE-acetaminophen (PERCOCET) 10-325 MG per tablet Take 1 tablet by mouth every 4 (four) hours as needed. pain      . potassium chloride (K-DUR,KLOR-CON) 10 MEQ tablet Take 10 mEq by mouth 2 (two) times daily.      . vitamin E 400 UNIT capsule Take 400 Units by mouth daily.      Review of Systems  Constitutional: Positive for fever, chills and malaise/fatigue.  HENT: Negative for ear pain and tinnitus.   Eyes: Negative for blurred vision, double vision and photophobia.  Respiratory: Positive for cough and shortness of breath. Negative for hemoptysis and sputum production.   Cardiovascular: Positive for leg swelling. Negative for chest pain, palpitations and orthopnea.  Gastrointestinal: Negative for nausea, vomiting and abdominal pain.  Genitourinary: Negative for dysuria and urgency.  Musculoskeletal: Positive for back pain and joint pain.  Skin: Positive for rash.  Neurological: Negative  for dizziness, tingling, sensory change and headaches.  Endo/Heme/Allergies: Negative for environmental allergies. Does not bruise/bleed easily.  Psychiatric/Behavioral: Negative for depression, suicidal ideas and substance abuse.   Blood pressure 156/43, pulse 55, temperature 98.2 F (36.8 C), temperature source Oral, resp. rate 23, height 5\' 9"  (1.753 m), weight 136.079 kg (300 lb), SpO2 90.00%. Physical Exam  Nursing note and vitals reviewed. Constitutional: He is oriented to person, place, and time. He appears well-developed and well-nourished. No distress.  HENT:  Nose: Nose  normal.  Mouth/Throat: Oropharynx is clear and moist. No oropharyngeal exudate.  Eyes: Conjunctivae normal and EOM are normal. Pupils are equal, round, and reactive to light. No scleral icterus.  Neck: Normal range of motion. Neck supple. No tracheal deviation present. No thyromegaly present.       JVD difficult to assess but appears 2 cm above clavicle at 60 degrees  Cardiovascular: Normal rate, regular rhythm and intact distal pulses.  Exam reveals no gallop.   Murmur heard.      Soft systolic murmur at LSB  Respiratory: Effort normal. No respiratory distress. He has no wheezes.       Slightly decreased BS; improved superior aeration  GI: Soft. Bowel sounds are normal. He exhibits no distension. There is no tenderness. There is no rebound.  Musculoskeletal: Normal range of motion. He exhibits edema and tenderness.  Neurological: He is alert and oriented to person, place, and time. No cranial nerve deficit. Coordination normal.       Left leg with warmth, erythema, swelling  Skin: Skin is warm. Rash noted. He is not diaphoretic. There is erythema.  Psychiatric: He has a normal mood and affect. His behavior is normal.   Chest x-ray: no acute process, ? Smaller lung fields EKGs reviewed: RBBB, one with poor baseline but what looks like AV dissociation/complete heart block, two subsequent EKGs with what appears to be 2:1 AV Block Labs reviewed; wbc 30k, h/h 12.4/35.5, plt 350, troponin negative, blood culture x2 drawn, urine with many bacteria/wbc Problem List Leukoytosis/Cellulitis/Urosepsis Shortness of breath Pyuria/bacteruria Elevated Lactate 2:1 AV Block with brief period of Complete Heart Block Heparinization for possible/presumed PE given SOB, left leg swelling, immobile status Acute vs. 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 Given age and comorbidities, he has significant reasons for conduction disease; however, he appears asymptomatic and he has significant infection at this time so permanent pacemaker placement if warranted will need to be deferred if at all possible for several days to clear the infection. Will give metoprolol time to wash-out and follow ECGs and telemetry while treating what appears to be a urosepsis picture.  - obtain complete echocardiogram to assess LV function and evaluate for structural abnormalities - on broad spectrum antibiotics for infectious clinical picture - hold all AV nodal agents (metoprolol he is on at home) - on heparin for possible/presumed PE  Luis Carrillo 05/23/2012, 3:31 AM

## 2012-05-23 NOTE — Progress Notes (Signed)
  Echocardiogram 2D Echocardiogram has been performed.  Luis Carrillo 05/23/2012, 2:24 PM

## 2012-05-23 NOTE — Progress Notes (Signed)
Patient: Luis Carrillo Date of Encounter: 05/23/2012, 7:18 AM Admit date: 05/22/2012     Subjective  Patient denies chest pain, chest pressure, dizziness or lightheadedness. Still c/o SOB managed by PCCM.   Objective  Physical Exam: Vitals: BP 127/115  Pulse 73  Temp 100.4 F (38 C) (Oral)  Resp 28  Ht 5\' 9"  (1.753 m)  Wt 326 lb 8 oz (148.1 kg)  BMI 48.22 kg/m2  SpO2 91%  General: Pleasant male patient in mild respiratory distress  Neuro: Awake, alert, oriented x 3, nonfocal  HEENT: PERRL, pink conjunctivae, moist membranes  Neck: Supple, unable to assess JVD due to body habitus Cardiovascular: RRR, no M/R/G  Lungs: Bilateral diminished air entry, no W/R/R  Abdomen: Soft, nontender, nondistended, bowel sounds present  Musculoskeletal: Moves all extremities, pedal edema present  Skin: LLE erythema and increased temperature, 1 cm superficial wound healing   Intake/Output:  Intake/Output Summary (Last 24 hours) at 05/23/12 0718 Last data filed at 05/23/12 0500  Gross per 24 hour  Intake   1750 ml  Output    200 ml  Net   1550 ml    Inpatient Medications:     . sodium chloride   Intravenous STAT  . [COMPLETED] albuterol  5 mg Nebulization Once  . famotidine (PEPCID) IV  20 mg Intravenous QHS  . [COMPLETED] fentaNYL  50 mcg Intravenous Once  . [COMPLETED] heparin  3,000 Units Intravenous Once  . [COMPLETED] heparin  3,200 Units Intravenous Once  . [COMPLETED]  HYDROmorphone (DILAUDID) injection  1 mg Intravenous Once  . influenza  inactive virus vaccine  0.5 mL Intramuscular Tomorrow-1000  . insulin aspart  2-6 Units Subcutaneous Q4H  . [COMPLETED] piperacillin-tazobactam (ZOSYN)  IV  3.375 g Intravenous Once  . piperacillin-tazobactam (ZOSYN)  IV  3.375 g Intravenous Q8H  . pneumococcal 23 valent vaccine  0.5 mL Intramuscular Tomorrow-1000  . [COMPLETED] sodium chloride  1,000 mL Intravenous Once  . [COMPLETED] sodium chloride  500 mL Intravenous Once  .  vancomycin  1,250 mg Intravenous Q24H  . [COMPLETED] vancomycin  1,000 mg Intravenous Once  . [COMPLETED] white petrolatum          . sodium chloride 1,000 mL (05/23/12 0245)  . heparin 1,750 Units/hr (05/23/12 0554)    Labs:  [facility] 05/23/12 0505 05/22/12 1948  NA 136 136  K 3.6 4.4  CL 98 103  CO2 20 --  GLUCOSE 225* 232*  BUN 36* 40*  CREATININE 2.34* 1.90*  CALCIUM 8.0* --  MG -- --  PHOS -- --    Basename 05/23/12 0505 05/22/12 1948 05/22/12 1933  WBC 28.8* -- 30.2*  NEUTROABS -- -- 26.6*  HGB 11.2* 12.9* --  HCT 33.1* 38.0* --  MCV 90.9 -- 91.0  PLT 295 -- 350    Basename 05/22/12 1933  CKTOTAL --  CKMB --  TROPONINI <0.30    Radiology/Studies: Dg Chest Port 1 View  05/22/2012  *RADIOLOGY REPORT*  Clinical Data: Mid chest pain, shortness of breath, hypertension, diabetes  PORTABLE CHEST - 1 VIEW  Comparison: Portable exam 1918 hours compared to 11/17/2008  Findings: Exclusion of costophrenic angles especially on the left. Upper normal heart size. Mediastinal contours and pulmonary vascularity normal. Question minimal right basilar atelectasis. Upper lungs clear. No gross pleural effusion or pneumothorax.  IMPRESSION: No definite acute abnormalities though the costophrenic angles particularly on the left are partially excluded. Consider follow-up upright PA and lateral chest radiographs if clinically indicated to better  evaluate the lung bases.   Original Report Authenticated By: Ulyses Southward, M.D.     Echocardiogram:Pending Carotid Doppler: Pending Telemetry:    Assessment and Plan  78 years old morbid obese male with PMH of DM, HTN, HLD, RMSF, family history only of a brother with MI  Presents with two weeks of worsening SOB, productive cough and more recently fever and chills. Over the last couple of days developed LLE erythema and swelling. At admission found to be briefly on complete heart block but now 2:1 AV block.  1. Brief period of complete heart  block, now 2:1 AV block     - patient is asymptomatic    - Hold AV nodal agents ( on Metoprolol at home)    - Follow EKG    - avoid PPM in the setting of significant infections.     - Echo today    - on Heparin gtt for ? DVT or presumed PE  2. Sepsis- Panculture, Vanc and Zosyn, PCCM   3. LLL pneumonia, PCCM  4. LLE cellulitis- LE doppler to rule out DVT. On Heparin drip   5. UTI, see above  6. Acute on chronic rena failure, per PCCM  7. HTN, hold home meds (HCTZ, Lisinopril, Glipizide,Metoprolol)  8. DM, SSI, consider adding Lantus. Per PCCM  9. Dyslipidemia   10. Morbid obesity  11. Code status:Full code  12. VTE: heparin drip     Signed, LI, NA PGY-2 7:19 AM  Attending Note:   The patient was seen and examined.  Agree with assessment and plan as noted above.  I have made changes where appropriate in the above H&P.  Heart block / 2:1 -  Will allow the Metoprolol to wash out.  Check TSH.  His hypoxemia may be contributing to his heart block.  He will need aggressive treatment of his pulmonary problems. He may need a pacer at some point.   Will need to wait until his infections have resolved.   Vesta Mixer, Montez Hageman., MD, [facility] 05/23/2012, 8:24 AM

## 2012-05-23 NOTE — Progress Notes (Signed)
VASCULAR LAB PRELIMINARY  PRELIMINARY  PRELIMINARY  PRELIMINARY  Bilateral lower extremity venous Dopplers completed.    Preliminary report:  There is no obvious evidence of DVT or SVT in the bilateral lower extremities.  Luis Carrillo, 05/23/2012, 12:19 PM

## 2012-05-23 NOTE — Progress Notes (Addendum)
ANTICOAGULATION CONSULT NOTE - Follow Up Consult  Pharmacy Consult for UFH Indication: R/O DVT/PE  Allergies  Allergen Reactions  . Alka-Seltzer Heartburn (Sodium Bicarbonate-Citric Acid)   . Citric Acid     All the ingredients in alka seltzer gold  . Potassium Bicarbonate     All the ingredients in alka seltzer gold  . Sodium Bicarbonate     All the ingredients in alka seltzer gold    Patient Measurements: Height: 5\' 9"  (175.3 cm) Weight: 326 lb 8 oz (148.1 kg) IBW/kg (Calculated) : 70.7  Heparin Dosing Weight: 106kg  Vital Signs: Temp: 99.8 F (37.7 C) (11/18 1125) Temp src: Oral (11/18 1125) BP: 120/46 mmHg (11/18 1533) Pulse Rate: 91  (11/18 1533)  Labs:  Basename 05/23/12 1522 05/23/12 0505 05/22/12 2033 05/22/12 1948 05/22/12 1933  HGB -- 11.2* -- 12.9* --  HCT -- 33.1* -- 38.0* 35.5*  PLT -- 295 -- -- 350  APTT -- -- 35 -- --  LABPROT -- -- 15.1 -- --  INR -- -- 1.21 -- --  HEPARINUNFRC 0.18* <0.10* -- -- --  CREATININE -- 2.34* -- 1.90* --  CKTOTAL -- -- -- -- --  CKMB -- -- -- -- --  TROPONINI -- -- -- -- <0.30    Estimated Creatinine Clearance: 36.8 ml/min (by C-G formula based on Cr of 2.34).   Medications:  Scheduled:    . [COMPLETED] sodium chloride   Intravenous STAT  . [COMPLETED] albuterol  5 mg Nebulization Once  . famotidine (PEPCID) IV  20 mg Intravenous QHS  . [COMPLETED] fentaNYL  50 mcg Intravenous Once  . [COMPLETED] heparin  3,000 Units Intravenous Once  . [COMPLETED] heparin  3,200 Units Intravenous Once  . [COMPLETED]  HYDROmorphone (DILAUDID) injection  1 mg Intravenous Once  . influenza  inactive virus vaccine  0.5 mL Intramuscular Tomorrow-1000  . insulin aspart  2-6 Units Subcutaneous Q4H  . [COMPLETED] perflutren lipid microspheres (DEFINITY) IV suspension      . [COMPLETED] piperacillin-tazobactam (ZOSYN)  IV  3.375 g Intravenous Once  . piperacillin-tazobactam (ZOSYN)  IV  3.375 g Intravenous Q8H  . pneumococcal 23 valent  vaccine  0.5 mL Intramuscular Tomorrow-1000  . [COMPLETED] sodium chloride  1,000 mL Intravenous Once  . [COMPLETED] sodium chloride  500 mL Intravenous Once  . vancomycin  1,500 mg Intravenous Q24H  . [COMPLETED] vancomycin  1,000 mg Intravenous Once  . [COMPLETED] white petrolatum      . [DISCONTINUED] vancomycin  1,250 mg Intravenous Q24H    Assessment: Pt is a 78 y/o M on heparin drip for possible PE/DVT. Heparin drip to be stopped if final report for LE doppler is negative; initial findings are negative. Heparin level at 1522 is 0.18 (heparin was off from approximately 0900-1000 per RN, they were thinking about putting in a central line). SCr 2.34, CrCl ~37. H/H 11.3/33.1, Plts 295. No evidence of overt bleeding reported.   Goal of Therapy:  Heparin level 0.3-0.7 units/ml Monitor platelets by anticoagulation protocol: Yes   Plan:  - Increase heparin drip from 2250 units/hr to 2450 units/hr - 8 hour heparin level at 0030 given renal failure - Daily CBC/HL if heparin drip continues - If final doppler report comes back negative, OK to stop heparin drip and start heparin [place] for prophylaxis per Dr. Molli Knock - Monitor for bleeding  Abran Duke, PharmD Clinical Pharmacist Phone: [phone] Pager: [phone] 05/23/2012 4:31 PM  Addendum:   Final report for LE Doppler negative for DVT.  Will  d/c Heparin infusion and start heparin 5000 units SQ q8h  Geannie Risen, PharmD, BCPS 05/23/2012 10:49 PM

## 2012-05-24 ENCOUNTER — Inpatient Hospital Stay (HOSPITAL_COMMUNITY): Payer: Medicare Other

## 2012-05-24 ENCOUNTER — Encounter (HOSPITAL_COMMUNITY): Payer: Self-pay

## 2012-05-24 DIAGNOSIS — N289 Disorder of kidney and ureter, unspecified: Secondary | ICD-10-CM

## 2012-05-24 LAB — PHOSPHORUS: Phosphorus: 3.3 mg/dL (ref 2.3–4.6)

## 2012-05-24 LAB — GLUCOSE, CAPILLARY
Glucose-Capillary: 121 mg/dL — ABNORMAL HIGH (ref 70–99)
Glucose-Capillary: 134 mg/dL — ABNORMAL HIGH (ref 70–99)
Glucose-Capillary: 145 mg/dL — ABNORMAL HIGH (ref 70–99)
Glucose-Capillary: 160 mg/dL — ABNORMAL HIGH (ref 70–99)
Glucose-Capillary: 162 mg/dL — ABNORMAL HIGH (ref 70–99)
Glucose-Capillary: 165 mg/dL — ABNORMAL HIGH (ref 70–99)

## 2012-05-24 LAB — BASIC METABOLIC PANEL
BUN: 38 mg/dL — ABNORMAL HIGH (ref 6–23)
CO2: 20 mEq/L (ref 19–32)
Calcium: 8.1 mg/dL — ABNORMAL LOW (ref 8.4–10.5)
Creatinine, Ser: 1.62 mg/dL — ABNORMAL HIGH (ref 0.50–1.35)
Glucose, Bld: 160 mg/dL — ABNORMAL HIGH (ref 70–99)

## 2012-05-24 LAB — URINE CULTURE
Colony Count: NO GROWTH
Culture: NO GROWTH

## 2012-05-24 LAB — CBC
HCT: 33.7 % — ABNORMAL LOW (ref 39.0–52.0)
MCHC: 33.5 g/dL (ref 30.0–36.0)
WBC: 25.9 10*3/uL — ABNORMAL HIGH (ref 4.0–10.5)

## 2012-05-24 LAB — LEGIONELLA ANTIGEN, URINE

## 2012-05-24 MED ORDER — SENNOSIDES-DOCUSATE SODIUM 8.6-50 MG PO TABS
1.0000 | ORAL_TABLET | Freq: Two times a day (BID) | ORAL | Status: DC
  Administered 2012-05-24 – 2012-05-31 (×12): 1 via ORAL
  Filled 2012-05-24 (×14): qty 1

## 2012-05-24 MED ORDER — OXYCODONE-ACETAMINOPHEN 5-325 MG PO TABS
1.0000 | ORAL_TABLET | Freq: Four times a day (QID) | ORAL | Status: DC | PRN
  Administered 2012-05-24 – 2012-05-27 (×5): 1 via ORAL
  Filled 2012-05-24 (×5): qty 1

## 2012-05-24 MED ORDER — INSULIN ASPART 100 UNIT/ML ~~LOC~~ SOLN
2.0000 [IU] | Freq: Three times a day (TID) | SUBCUTANEOUS | Status: DC
  Administered 2012-05-25: 2 [IU] via SUBCUTANEOUS
  Administered 2012-05-25 – 2012-05-26 (×3): 4 [IU] via SUBCUTANEOUS
  Administered 2012-05-26: 2 [IU] via SUBCUTANEOUS
  Administered 2012-05-26: 4 [IU] via SUBCUTANEOUS

## 2012-05-24 MED ORDER — CLINDAMYCIN PHOSPHATE 900 MG/50ML IV SOLN
900.0000 mg | Freq: Three times a day (TID) | INTRAVENOUS | Status: DC
  Administered 2012-05-24 – 2012-05-26 (×6): 900 mg via INTRAVENOUS
  Filled 2012-05-24 (×9): qty 50

## 2012-05-24 MED ORDER — MAGNESIUM HYDROXIDE 400 MG/5ML PO SUSP: 30.0000 mL | Freq: Once | ORAL | Status: DC

## 2012-05-24 NOTE — Consult Note (Signed)
WOC consult Note Reason for Consult: Consult requested for left leg cellulitis Wound type: Partial thickness wound to left thigh Pressure Ulcer POA: Not a pressure ulcer Measurement:  1X1X.1cm Wound bed: pink and moist Drainage (amount, consistency, odor) small yellow drainage, no odor Periwound: Generalized erythremia and edema extends from left foot to left upper thigh.  Pt is on IV antibiotics.  Topical care will be minimally effective. Dressing procedure/placement/frequency: Foam dressing to absorb drainage and promote healing. Will not plan to follow further unless re-consulted.  [address] RN, MSN, [employer]  [phone]

## 2012-05-24 NOTE — Progress Notes (Signed)
eLink Physician-Brief Progress Note Patient Name: Luis Carrillo DOB: 06-02-1931 MRN: [id number]  Date of Service  05/24/2012   HPI/Events of Note  Cellulitis of the L leg complaining of pain, home on percocet   eICU Interventions  Oxycodone ordered prn      Shantrice Rodenberg 05/24/2012, 10:13 PM

## 2012-05-24 NOTE — Consult Note (Signed)
[facility]  Total days of antibiotics 3        Day 3 piptazo        Day 3 vancomycin               Reason for Consult: worsening cellulitis    Referring Physician: [email or web address]  Active Problems:  * No active hospital problems. *     HPI: Luis Carrillo is a 78 y.o. male with morbid obese male, DM, HTN, dyslipidemia and chronic pain on narcotics presented to the ED on 11/17 with new onset of left leg swelling and erythema and fevers, but also known to have  2 wks of  of worsening SOB, productive cough. His work up in the ED, found him to be febrile at 100.8, hemodynamically stable. His lab work revealed a significant leukocytosis of 30, with > 20% bands, Lactic acidosis of 4.6. He was also noted to have  2:1 AV block on telemetry with occ. CHB. Thus cardiology was consulted for his arrythmia. He was started on empiric antibiotics of vancomycin and piptazo for cellulitis vs. Pneumonia. His physical exam was impressive for significant cellulitis of his left leg that had increased warmth and erythema in comparison to unaffected leg. His infectious work up included ua, cxr, urine cx, and blood cx. UA did show a few wbc but small LE and negative nitrites. Over the last 48hrs, he did have a tmax of 102.5 on 11/18, his wbc has trended dow to 26, mild improvement in lactic acid, but erythema to his leg had progressed. MR. Hallum's daughter mentioned that his wound on his leg was initially involving the shin where he originally bumped his leg against furniture started to become increasing erythematous,which spread to just below his knee. Since he has been admitted, erythema has extended above knee in anterior compartment and up dorsal aspect of his thigh.   Clindamycin was added today and he underwent a leg mri which should diffuse edema,but no subcutaneous air, myositis, abscess or osteomyelitis.  Past Medical History  Diagnosis Date  . Diabetes mellitus without complication   .  COPD (chronic obstructive pulmonary disease)   . Hypertension     Allergies:  Allergies  Allergen Reactions  . Alka-Seltzer Heartburn (Sodium Bicarbonate-Citric Acid)   . Citric Acid     All the ingredients in alka seltzer gold  . Potassium Bicarbonate     All the ingredients in alka seltzer gold  . Sodium Bicarbonate     All the ingredients in alka seltzer gold   MEDICATIONS:    . clindamycin (CLEOCIN) IV  900 mg Intravenous Q8H  . famotidine (PEPCID) IV  20 mg Intravenous QHS  . heparin subcutaneous  5,000 Units Subcutaneous Q8H  . influenza  inactive virus vaccine  0.5 mL Intramuscular Tomorrow-1000  . insulin aspart  2-6 Units Subcutaneous Q4H  . magnesium hydroxide  30 mL Oral Once  . piperacillin-tazobactam (ZOSYN)  IV  3.375 g Intravenous Q8H  . pneumococcal 23 valent vaccine  0.5 mL Intramuscular Tomorrow-1000  . senna-docusate  1 tablet Oral BID  . vancomycin  1,500 mg Intravenous Q24H    History  Substance Use Topics  . Smoking status: Former Smoker -- 1.0 packs/day for 20 years    Types: Cigarettes    Quit date: 05/24/1969  . Smokeless tobacco: Never Used  . Alcohol Use:     History reviewed. No pertinent family history.   Review of Systems  Constitutional: Negative for fever,  chills, diaphoresis, activity change, appetite change, fatigue and unexpected weight change.  HENT: Negative for congestion, sore throat, rhinorrhea, sneezing, trouble swallowing and sinus pressure.  Eyes: Negative for photophobia and visual disturbance.  Respiratory: Negative for cough, chest tightness, shortness of breath, wheezing and stridor.  Cardiovascular: Negative for chest pain, palpitations and leg swelling.  Gastrointestinal: Negative for nausea, vomiting, abdominal pain, diarrhea, constipation, blood in stool, abdominal distention and anal bleeding.  Genitourinary: Negative for dysuria, hematuria, flank pain and difficulty urinating.  Musculoskeletal: Negative for  myalgias, back pain, joint swelling, arthralgias and gait problem.  Skin: Negative for color change, pallor, rash and wound.  Neurological: Negative for dizziness, tremors, weakness and light-headedness.  Hematological: Negative for adenopathy. Does not bruise/bleed easily.  Psychiatric/Behavioral: Negative for behavioral problems, confusion, sleep disturbance, dysphoric mood, decreased concentration and agitation.     OBJECTIVE: Temp:  [98.2 F (36.8 C)-100.2 F (37.9 C)] 99.1 F (37.3 C) (11/19 1600) Pulse Rate:  [42-93] 88  (11/19 1600) Resp:  [12-35] 31  (11/19 1600) BP: (105-157)/(27-100) 144/68 mmHg (11/19 1600) SpO2:  [92 %-100 %] 99 % (11/19 1600) FiO2 (%):  [40 %] 40 % (11/19 1039) Weight:  [330 lb 7.5 oz (149.9 kg)] 330 lb 7.5 oz (149.9 kg) (11/19 0500)  Constitutional: He is oriented to person, place, and time. He appears well-developed and well-nourished. No distress.  HENT:  Nose: Nose normal.  Mouth/Throat: Oropharynx is clear and moist. No oropharyngeal exudate.  Eyes: Conjunctivae normal and EOM are normal. Pupils are equal, round, and reactive to light. No scleral icterus.  Neck: Normal range of motion. Neck supple. No tracheal deviation present. No thyromegaly present.  JVD difficult to assess but appears 2 cm above clavicle at 60 degrees  Cardiovascular: Normal rate, regular rhythm and intact distal pulses. Exam reveals no gallop.  Soft systolic murmur at LSB  Respiratory: Effort normal. No respiratory distress. He has no wheezes.  Slightly decreased BS; improved superior aeration  GI: Soft. Bowel sounds are normal. He exhibits no distension. There is no tenderness. There is no rebound.  Musculoskeletal: Normal range of motion. He exhibits edema and tenderness.  Neurological: He is alert and oriented to person, place, and time. No cranial nerve deficit. Coordination normal.  Left leg with warmth, erythema, swelling+ 1 edema at ankle and distal tibia. Skin:  quarter sized ulcer on anterior aspect of tibia with surrounding erythema, most intense on anterior aspect and extending above knee, blanching. throughout . Erythema extends up medial aspect of quads    LABS: Results for orders placed during the hospital encounter of 05/22/12 (from the past 48 hour(s))  CBC WITH DIFFERENTIAL     Status: Abnormal   Collection Time   05/22/12  7:33 PM      Component Value Range Comment   WBC 30.2 (*) 4.0 - 10.5 K/uL    RBC 3.90 (*) 4.22 - 5.81 MIL/uL    Hemoglobin 12.4 (*) 13.0 - 17.0 g/dL    HCT 69.6 (*) 29.5 - 52.0 %    MCV 91.0  78.0 - 100.0 fL    MCH 31.8  26.0 - 34.0 pg    MCHC 34.9  30.0 - 36.0 g/dL    RDW 28.4  13.2 - 44.0 %    Platelets 350  150 - 400 K/uL    Neutrophils Relative 88 (*) 43 - 77 %    Lymphocytes Relative 6 (*) 12 - 46 %    Monocytes Relative 6  3 - 12 %  Eosinophils Relative 0  0 - 5 %    Basophils Relative 0  0 - 1 %    Neutro Abs 26.6 (*) 1.7 - 7.7 K/uL    Lymphs Abs 1.8  0.7 - 4.0 K/uL    Monocytes Absolute 1.8 (*) 0.1 - 1.0 K/uL    Eosinophils Absolute 0.0  0.0 - 0.7 K/uL    Basophils Absolute 0.0  0.0 - 0.1 K/uL    WBC Morphology INCREASED BANDS (>20% BANDS)     TROPONIN I     Status: Normal   Collection Time   05/22/12  7:33 PM      Component Value Range Comment   Troponin I <0.30  <0.30 ng/mL   POCT I-STAT, CHEM 8     Status: Abnormal   Collection Time   05/22/12  7:48 PM      Component Value Range Comment   Sodium 136  135 - 145 mEq/L    Potassium 4.4  3.5 - 5.1 mEq/L    Chloride 103  96 - 112 mEq/L    BUN 40 (*) 6 - 23 mg/dL    Creatinine, Ser 1.61 (*) 0.50 - 1.35 mg/dL    Glucose, Bld 096 (*) 70 - 99 mg/dL    Calcium, Ion 0.45 (*) 1.13 - 1.30 mmol/L    TCO2 22  0 - 100 mmol/L    Hemoglobin 12.9 (*) 13.0 - 17.0 g/dL    HCT 40.9 (*) 81.1 - 52.0 %   CULTURE, BLOOD (ROUTINE X 2)     Status: Normal (Preliminary result)   Collection Time   05/22/12  8:33 PM      Component Value Range Comment   Specimen  Description BLOOD RIGHT HAND      Special Requests BOTTLES DRAWN AEROBIC AND ANAEROBIC 3CC      Culture  Setup Time 05/23/2012 08:55      Culture        Value:        BLOOD CULTURE RECEIVED NO GROWTH TO DATE CULTURE WILL BE HELD FOR 5 DAYS BEFORE ISSUING A FINAL NEGATIVE REPORT   Report Status PENDING     LACTIC ACID, PLASMA     Status: Abnormal   Collection Time   05/22/12  8:33 PM      Component Value Range Comment   Lactic Acid, Venous 4.2 (*) 0.5 - 2.2 mmol/L   PROTIME-INR     Status: Normal   Collection Time   05/22/12  8:33 PM      Component Value Range Comment   Prothrombin Time 15.1  11.6 - 15.2 seconds    INR 1.21  0.00 - 1.49   APTT     Status: Normal   Collection Time   05/22/12  8:33 PM      Component Value Range Comment   aPTT 35  24 - 37 seconds   CULTURE, BLOOD (ROUTINE X 2)     Status: Normal (Preliminary result)   Collection Time   05/22/12  8:44 PM      Component Value Range Comment   Specimen Description BLOOD RIGHT HAND      Special Requests BOTTLES DRAWN AEROBIC AND ANAEROBIC 5CC      Culture  Setup Time 05/23/2012 08:54      Culture        Value:        BLOOD CULTURE RECEIVED NO GROWTH TO DATE CULTURE WILL BE HELD FOR 5 DAYS BEFORE ISSUING A FINAL NEGATIVE REPORT  Report Status PENDING     URINALYSIS, ROUTINE W REFLEX MICROSCOPIC     Status: Abnormal   Collection Time   05/22/12  9:05 PM      Component Value Range Comment   Color, Urine AMBER (*) YELLOW BIOCHEMICALS MAY BE AFFECTED BY COLOR   APPearance CLOUDY (*) CLEAR    Specific Gravity, Urine 1.024  1.005 - 1.030    pH 5.0  5.0 - 8.0    Glucose, UA NEGATIVE  NEGATIVE mg/dL    Hgb urine dipstick LARGE (*) NEGATIVE    Bilirubin Urine SMALL (*) NEGATIVE    Ketones, ur TRACE (*) NEGATIVE mg/dL    Protein, ur 30 (*) NEGATIVE mg/dL    Urobilinogen, UA 1.0  0.0 - 1.0 mg/dL    Nitrite NEGATIVE  NEGATIVE    Leukocytes, UA SMALL (*) NEGATIVE   URINE MICROSCOPIC-ADD ON     Status: Abnormal   Collection  Time   05/22/12  9:05 PM      Component Value Range Comment   Squamous Epithelial / LPF RARE  RARE    WBC, UA 11-20  <3 WBC/hpf    RBC / HPF TOO NUMEROUS TO COUNT  <3 RBC/hpf    Bacteria, UA MANY (*) RARE    Casts GRANULAR CAST (*) NEGATIVE    Urine-Other AMORPHOUS URATES/PHOSPHATES     URINE CULTURE     Status: Normal (Preliminary result)   Collection Time   05/22/12  9:05 PM      Component Value Range Comment   Specimen Description URINE, CATHETERIZED      Special Requests NONE      Culture  Setup Time 05/23/2012 09:51      Colony Count 35,000 COLONIES/ML      Culture GRAM NEGATIVE RODS      Report Status PENDING     GLUCOSE, CAPILLARY     Status: Abnormal   Collection Time   05/23/12  1:15 AM      Component Value Range Comment   Glucose-Capillary 243 (*) 70 - 99 mg/dL   MRSA PCR SCREENING     Status: Normal   Collection Time   05/23/12  2:31 AM      Component Value Range Comment   MRSA by PCR NEGATIVE  NEGATIVE   GLUCOSE, CAPILLARY     Status: Abnormal   Collection Time   05/23/12  3:27 AM      Component Value Range Comment   Glucose-Capillary 220 (*) 70 - 99 mg/dL   HEPARIN LEVEL (UNFRACTIONATED)     Status: Abnormal   Collection Time   05/23/12  5:05 AM      Component Value Range Comment   Heparin Unfractionated <0.10 (*) 0.30 - 0.70 IU/mL   CBC     Status: Abnormal   Collection Time   05/23/12  5:05 AM      Component Value Range Comment   WBC 28.8 (*) 4.0 - 10.5 K/uL    RBC 3.64 (*) 4.22 - 5.81 MIL/uL    Hemoglobin 11.2 (*) 13.0 - 17.0 g/dL    HCT 40.9 (*) 81.1 - 52.0 %    MCV 90.9  78.0 - 100.0 fL    MCH 30.8  26.0 - 34.0 pg    MCHC 33.8  30.0 - 36.0 g/dL    RDW 91.4  78.2 - 95.6 %    Platelets 295  150 - 400 K/uL   BASIC METABOLIC PANEL     Status: Abnormal  Collection Time   05/23/12  5:05 AM      Component Value Range Comment   Sodium 136  135 - 145 mEq/L    Potassium 3.6  3.5 - 5.1 mEq/L    Chloride 98  96 - 112 mEq/L    CO2 20  19 - 32 mEq/L     Glucose, Bld 225 (*) 70 - 99 mg/dL    BUN 36 (*) 6 - 23 mg/dL    Creatinine, Ser 7.82 (*) 0.50 - 1.35 mg/dL    Calcium 8.0 (*) 8.4 - 10.5 mg/dL    GFR calc non Af Amer 25 (*) >90 mL/min    GFR calc Af Amer 29 (*) >90 mL/min   LACTIC ACID, PLASMA     Status: Abnormal   Collection Time   05/23/12  5:05 AM      Component Value Range Comment   Lactic Acid, Venous 2.4 (*) 0.5 - 2.2 mmol/L   GLUCOSE, CAPILLARY     Status: Abnormal   Collection Time   05/23/12  8:07 AM      Component Value Range Comment   Glucose-Capillary 189 (*) 70 - 99 mg/dL    Comment 1 Notify RN     HEPATIC FUNCTION PANEL     Status: Abnormal   Collection Time   05/23/12  9:10 AM      Component Value Range Comment   Total Protein 6.6  6.0 - 8.3 g/dL    Albumin 2.5 (*) 3.5 - 5.2 g/dL    AST 94 (*) 0 - 37 U/L    ALT 68 (*) 0 - 53 U/L    Alkaline Phosphatase 69  39 - 117 U/L    Total Bilirubin 0.4  0.3 - 1.2 mg/dL    Bilirubin, Direct 0.2  0.0 - 0.3 mg/dL    Indirect Bilirubin 0.2 (*) 0.3 - 0.9 mg/dL   HEMOGLOBIN N5A     Status: Abnormal   Collection Time   05/23/12  9:10 AM      Component Value Range Comment   Hemoglobin A1C 6.7 (*) <5.7 %    Mean Plasma Glucose 146 (*) <117 mg/dL   TSH     Status: Normal   Collection Time   05/23/12  9:10 AM      Component Value Range Comment   TSH 0.525  0.350 - 4.500 uIU/mL   POCT I-STAT 3, BLOOD GAS (G3+)     Status: Abnormal   Collection Time   05/23/12 10:41 AM      Component Value Range Comment   pH, Arterial 7.407  7.350 - 7.450    pCO2 arterial 34.2 (*) 35.0 - 45.0 mmHg    pO2, Arterial 83.0  80.0 - 100.0 mmHg    Bicarbonate 21.5  20.0 - 24.0 mEq/L    TCO2 23  0 - 100 mmol/L    O2 Saturation 96.0      Acid-base deficit 3.0 (*) 0.0 - 2.0 mmol/L    Collection site RADIAL, ALLEN'S TEST ACCEPTABLE      Drawn by Operator      Sample type ARTERIAL     GLUCOSE, CAPILLARY     Status: Abnormal   Collection Time   05/23/12 11:14 AM      Component Value Range  Comment   Glucose-Capillary 182 (*) 70 - 99 mg/dL    Comment 1 Notify RN     HEPARIN LEVEL (UNFRACTIONATED)     Status: Abnormal   Collection Time  05/23/12  3:22 PM      Component Value Range Comment   Heparin Unfractionated 0.18 (*) 0.30 - 0.70 IU/mL   GLUCOSE, CAPILLARY     Status: Abnormal   Collection Time   05/23/12  4:21 PM      Component Value Range Comment   Glucose-Capillary 141 (*) 70 - 99 mg/dL    Comment 1 Notify RN     LEGIONELLA ANTIGEN, URINE     Status: Normal   Collection Time   05/23/12  5:11 PM      Component Value Range Comment   Specimen Description URINE, CATHETERIZED      Special Requests NONE      Legionella Antigen, Urine Negative for Legionella pneumophilia serogroup 1      Report Status 05/24/2012 FINAL     STREP PNEUMONIAE URINARY ANTIGEN     Status: Normal   Collection Time   05/23/12  5:11 PM      Component Value Range Comment   Strep Pneumo Urinary Antigen NEGATIVE  NEGATIVE   GLUCOSE, CAPILLARY     Status: Abnormal   Collection Time   05/23/12  7:39 PM      Component Value Range Comment   Glucose-Capillary 143 (*) 70 - 99 mg/dL   GLUCOSE, CAPILLARY     Status: Abnormal   Collection Time   05/23/12 11:34 PM      Component Value Range Comment   Glucose-Capillary 126 (*) 70 - 99 mg/dL   GLUCOSE, CAPILLARY     Status: Abnormal   Collection Time   05/24/12  4:59 AM      Component Value Range Comment   Glucose-Capillary 160 (*) 70 - 99 mg/dL   CBC     Status: Abnormal   Collection Time   05/24/12  5:30 AM      Component Value Range Comment   WBC 25.9 (*) 4.0 - 10.5 K/uL    RBC 3.70 (*) 4.22 - 5.81 MIL/uL    Hemoglobin 11.3 (*) 13.0 - 17.0 g/dL    HCT 14.7 (*) 82.9 - 52.0 %    MCV 91.1  78.0 - 100.0 fL    MCH 30.5  26.0 - 34.0 pg    MCHC 33.5  30.0 - 36.0 g/dL    RDW 56.2  13.0 - 86.5 %    Platelets 257  150 - 400 K/uL   BASIC METABOLIC PANEL     Status: Abnormal   Collection Time   05/24/12  5:30 AM      Component Value Range  Comment   Sodium 138  135 - 145 mEq/L    Potassium 3.5  3.5 - 5.1 mEq/L    Chloride 103  96 - 112 mEq/L    CO2 20  19 - 32 mEq/L    Glucose, Bld 160 (*) 70 - 99 mg/dL    BUN 38 (*) 6 - 23 mg/dL    Creatinine, Ser 7.84 (*) 0.50 - 1.35 mg/dL    Calcium 8.1 (*) 8.4 - 10.5 mg/dL    GFR calc non Af Amer 39 (*) >90 mL/min    GFR calc Af Amer 45 (*) >90 mL/min   MAGNESIUM     Status: Normal   Collection Time   05/24/12  5:30 AM      Component Value Range Comment   Magnesium 1.9  1.5 - 2.5 mg/dL   PHOSPHORUS     Status: Normal   Collection Time   05/24/12  5:30 AM  Component Value Range Comment   Phosphorus 3.3  2.3 - 4.6 mg/dL   GLUCOSE, CAPILLARY     Status: Abnormal   Collection Time   05/24/12  7:33 AM      Component Value Range Comment   Glucose-Capillary 134 (*) 70 - 99 mg/dL   GLUCOSE, CAPILLARY     Status: Abnormal   Collection Time   05/24/12 11:39 AM      Component Value Range Comment   Glucose-Capillary 121 (*) 70 - 99 mg/dL    Ua: + blood, granular cast, small LE, negative nitrites, 11-20 wbc  MICRO: 11/17 blood cx NGTD 11/17 urine cx: 35,000 GNR 11/18 urine cx: PENDING  IMAGING: Mr Tibia Fibula Left Wo Contrast  05/24/2012  *RADIOLOGY REPORT*  Clinical Data: Left lower extremity cellulitis.  MRI OF THE LEFT LOWER LEG WITHOUT CONTRAST  Technique:  Multiplanar, multisequence MR imaging of the left lower leg was performed.  No intravenous contrast was administered.  Comparison:   None  Findings:  There is circumferential subcutaneous edema in the left lower leg.  However, there is no myositis or osteomyelitis.  There is no fluid along the deep fascial planes.  There is no subcutaneous abscess.  No appreciable ankle joint or knee joint effusions.  IMPRESSION: Diffuse subcutaneous edema consistent with cellulitis.  No myositis, osteomyelitis, or abscess.   Original Report Authenticated By: Francene Boyers, M.D.    US Renal Port  05/23/2012  *RADIOLOGY REPORT*  Clinical  Data: Acute renal failure.  RENAL/URINARY TRACT ULTRASOUND COMPLETE  Comparison:  None.  Findings:  Right Kidney:  11.6 cm in length. Normal renal cortical thickness and echogenicity without focal lesions or hydronephrosis.  Left Kidney:  11.8 cm in length. Normal renal cortical thickness and echogenicity without focal lesions or hydronephrosis.  Bladder:  Decompressed by a Foley catheter.  IMPRESSION: Normal renal ultrasound examination.  No hydronephrosis.   Original Report Authenticated By: Rudie Meyer, M.D.    Dg Chest Port 1 View  05/24/2012  *RADIOLOGY REPORT*  Clinical Data: Respiratory failure, shortness of breath  PORTABLE CHEST - 1 VIEW  Comparison: Portable chest x-ray of 05/22/2012  Findings: Mild atelectasis remains at the lung bases.  No focal infiltrate or effusion is seen.  Cardiomegaly is stable.  No bony abnormality is noted.  IMPRESSION: Persistent bibasilar linear atelectasis.  Stable cardiomegaly.   Original Report Authenticated By: Dwyane Dee, M.D.    Dg Chest Port 1 View  05/22/2012  *RADIOLOGY REPORT*  Clinical Data: Mid chest pain, shortness of breath, hypertension, diabetes  PORTABLE CHEST - 1 VIEW  Comparison: Portable exam 1918 hours compared to 11/17/2008  Findings: Exclusion of costophrenic angles especially on the left. Upper normal heart size. Mediastinal contours and pulmonary vascularity normal. Question minimal right basilar atelectasis. Upper lungs clear. No gross pleural effusion or pneumothorax.  IMPRESSION: No definite acute abnormalities though the costophrenic angles particularly on the left are partially excluded. Consider follow-up upright PA and lateral chest radiographs if clinically indicated to better evaluate the lung bases.   Original Report Authenticated By: Ulyses Southward, M.D.    Assessment/Plan:  78yo Male presents with LLE cellulitis, WBC of 30 with greater than 20% bands and lactic acidosis currently on  clindamycin, vancomycin and piptazo.  1)  cellulitis = likely due to strep or staph. continue with vancomycin and piptazo. If WBC continues to improve, will switch piptazo to cefazolin. . Worsening erythema is likely due to response to antibiotics. Agree with keeping clindamycin on for the  next 2-3days for toxin production. Leg has been outlined to see that he is responding to therapy. Please check cbc with diff in the am  2) bacturia= initial ur cx 35,000 GNR. Likely red herring to his clinical picture. His presentation and severity of illness is due to cellulitis and not likely uti. Patient denies any dysuria.  3) AV block = unless patient has bacteremia, then consider TEE to rule out endocarditis as part of etiology of arrythmia.  Will provide further recs as more results become available.  Duke Salvia Drue Second MD MPH [facility] [phone]

## 2012-05-24 NOTE — Progress Notes (Signed)
Placed on monitor, in sinus rhythm 78.Spo2 97 on 2L [place].

## 2012-05-24 NOTE — Progress Notes (Addendum)
Name: Luis Carrillo MRN: [id number] DOB: Mar 28, 1931    LOS: 2  PULMONARY / CRITICAL CARE MEDICINE  HPI:  78 years old morbid obese male with PMH relevant for DM, HTN, dyslipidemia and chronic pain on narcotics. Presents with two weeks of worsening SOB, productive cough and more recently fever and chills. Over the last couple of days developed LLE erythema and swelling. Does not recall trauma but there is a healing wound in the LLE. At the time of my exam awake, alert, oriented x3 and hemodynamically stable. At admission found to be briefly on complete heart block but now 2:1 AV block. Cardiology was consulted. UA found consistent with UTI. Denies N/V/D, urinary symptoms. No CP.  Current Status:   Vital Signs: Temp:  [98.2 F (36.8 C)-102.5 F (39.2 C)] 98.2 F (36.8 C) (11/19 0700) Pulse Rate:  [42-93] 84  (11/19 1039) Resp:  [12-35] 29  (11/19 1039) BP: (105-157)/(27-100) 157/66 mmHg (11/19 1039) SpO2:  [92 %-100 %] 100 % (11/19 1039) FiO2 (%):  [40 %] 40 % (11/19 1039) Weight:  [149.9 kg (330 lb 7.5 oz)] 149.9 kg (330 lb 7.5 oz) (11/19 0500)  Physical Examination: General:  Pleasant male patient in mild respiratory distress Neuro:  Awake, alert, oriented x 3, nonfocal HEENT:  PERRL, pink conjunctivae, moist membranes Neck:  Supple, no JVD   Cardiovascular:  RRR, no M/R/G Lungs:  Bilateral diminished air entry, no W/R/R Abdomen:  Soft, nontender, nondistended, bowel sounds present Musculoskeletal:  Moves all extremities, pedal edema present Skin:  LLE erythema and increased temperature, 1 cm superficial wound healing.  ASSESSMENT AND PLAN 1) Sepsis 2) Possible pneumonia 3) LLE cellulitis, will rule out DVT. 4) UTI 5) Brief period of complete heart block, now 2:1 AV block 6) HTN 7) DM 8) Dyslipidemia 9) Morbid obesity.  PULMONARY  Lab 05/23/12 1041  PHART 7.407  PCO2ART 34.2*  PO2ART 83.0  HCO3 21.5  O2SAT 96.0   Ventilator Settings: Vent Mode:  [-] PCV FiO2 (%):   [40 %] 40 % Set Rate:  [8 bmp] 8 bmp PEEP:  [6 cmH20] 6 cmH20 CXR:  Blurring of the left hemidiaphragm suggestive of LLL pneumonia.  A:  1) Possible LLL pneumonia P:   1) Will treat with Zosyn and Vancomycin. 2) Back on BiPAP now for increased work of breathing.  CARDIOVASCULAR  Lab 05/23/12 0505 05/22/12 2033 05/22/12 1933  TROPONINI -- -- <0.30  LATICACIDVEN 2.4* 4.2* --  PROBNP -- -- --   ECG:  Last EKG on NSR, RBBB Lines: Peripheral lines  A:  1) Sepsis 2) Lactic acidosis 3) Brief episode of complete heart block and 2:1 AV block 4) LLE cellulitis and edema P:  1) KVO IVF and hold lasix given renal function. 2) Antibiotics as above. 3) We will continue to hold antihypertensive medications. 4) LE venous doppler to rule out DVT negative. 5) D/C heparin drip.  RENAL  Lab 05/24/12 0530 05/23/12 0505 05/22/12 1948  NA 138 136 136  K 3.5 3.6 --  CL 103 98 103  CO2 20 20 --  BUN 38* 36* 40*  CREATININE 1.62* 2.34* 1.90*  CALCIUM 8.1* 8.0* --  MG 1.9 -- --  PHOS 3.3 -- --   Intake/Output      11/18 0701 - 11/19 0700 11/19 0701 - 11/20 0700   P.O. 160    I.V. (mL/kg) 688.5 (4.6)    IV Piggyback 616    Total Intake(mL/kg) 1464.5 (9.8)    Urine (mL/kg/hr) 1400 (0.4)  Total Output 1400    Net +64.5         Urine Occurrence 3 x      Intake/Output Summary (Last 24 hours) at 05/24/12 1052 Last data filed at 05/24/12 0700  Gross per 24 hour  Intake  848.5 ml  Output   1350 ml  Net -501.5 ml   Foley:  05/22/12  A:   1) Acute renal failure with BUN:Cr dropping with hydration but Cr rising. P:   1) KVO IVF. 2) Monitor chemistry in am. 3) Continue to hold lasix for now. 4) BMET in AM.  GASTROINTESTINAL  Lab 05/23/12 0910  AST 94*  ALT 68*  ALKPHOS 69  BILITOT 0.4  PROT 6.6  ALBUMIN 2.5*    A:   1) No issues P:   - GI prophylaxis with pepcid.  HEMATOLOGIC  Lab 05/24/12 0530 05/23/12 0505 05/22/12 2033 05/22/12 1948 05/22/12 1933  HGB  11.3* 11.2* -- 12.9* 12.4*  HCT 33.7* 33.1* -- 38.0* 35.5*  PLT 257 295 -- -- 350  INR -- -- 1.21 -- --  APTT -- -- 35 -- --   A:   1) No issues  INFECTIOUS  Lab 05/24/12 0530 05/23/12 0505 05/22/12 1933  WBC 25.9* 28.8* 30.2*  PROCALCITON -- -- --   Cultures: Blood, s[putum and urine cultures ordered. Antibiotics: Zosyn and Vancomycin 05/22/12  A:   1) Sepsis - Possible pneumonia - LLE cellulitis - UTI P:   - Antibiotics as above, will narrow once Cx results - Wound care consult for expanding cellulitis. - Increase zosyn dose for pseudomonas.  ENDOCRINE  Lab 05/24/12 0733 05/24/12 0459 05/23/12 2334 05/23/12 1939 05/23/12 1621  GLUCAP 134* 160* 126* 143* 141*   A:   1) DM P:   - Insulin sliding scale  NEUROLOGIC  A:   1) No issues  BEST PRACTICE / DISPOSITION - Level of Care: ICU - Primary Service:  PCCM - Consultants:  Cardiology - Code Status:  Full code - Diet:  Clear liquids - DVT Px:  Heparin drip - GI Px:  Pepcid - Skin Integrity:  LLE cellulitis - Social / Family:  Family updated at bedside.  Rapidly developing respiratory failure, IVF overloaded and PNA in addition to body habitus all contributing, concern for intubation is that we will not be able to get him off the ventilator.  Will continue BiPAP for now, would like to diurese but will wait until renal function improves.  The patient is critically ill with multiple organ systems failure and requires high complexity decision making for assessment and support, frequent evaluation and titration of therapies, application of advanced monitoring technologies and extensive interpretation of multiple databases.   CC time 35 min.  Alyson Reedy, M.D. [facility] Pulmonary/Critical Care Medicine. Pager: [phone]. After hours pager: [phone].

## 2012-05-24 NOTE — Progress Notes (Addendum)
On table with HOB up as high as possible, O2 3L [place] as lying fairly flat. Sp02 95% and knows to call if SOB. NSR 93

## 2012-05-24 NOTE — Progress Notes (Signed)
Patient: Luis Carrillo Date of Encounter: 05/24/2012, 7:19 AM Admit date: 05/22/2012     Subjective  Patient denies chest pain, chest pressure, dizziness or lightheadedness. Still c/o SOB managed by PCCM.    Objective  Physical Exam: Vitals: BP 128/48  Pulse 86  Temp 99.5 F (37.5 C) (Axillary)  Resp 32  Ht 5\' 9"  (1.753 m)  Wt 330 lb 7.5 oz (149.9 kg)  BMI 48.80 kg/m2  SpO2 100% General: Pleasant male patient in mild respiratory distress  Neuro: Awake, alert, oriented x 3, nonfocal  HEENT: PERRL, pink conjunctivae, moist membranes  Neck: Supple, unable to assess JVD due to body habitus  Cardiovascular: RRR, no M/R/G  Lungs: Bilateral diminished air entry, no W/R/R  Abdomen: Soft, nontender, nondistended, bowel sounds present  Musculoskeletal: Moves all extremities, pedal edema present  Skin: worsening LLE erythema and increased temperature, 1 cm superficial wound healing   Intake/Output:  Intake/Output Summary (Last 24 hours) at 05/24/12 0719 Last data filed at 05/24/12 0700  Gross per 24 hour  Intake 1464.5 ml  Output   1400 ml  Net   64.5 ml    Inpatient Medications:     . [COMPLETED] sodium chloride   Intravenous STAT  . famotidine (PEPCID) IV  20 mg Intravenous QHS  . heparin subcutaneous  5,000 Units Subcutaneous Q8H  . influenza  inactive virus vaccine  0.5 mL Intramuscular Tomorrow-1000  . insulin aspart  2-6 Units Subcutaneous Q4H  . [COMPLETED] perflutren lipid microspheres (DEFINITY) IV suspension      . piperacillin-tazobactam (ZOSYN)  IV  3.375 g Intravenous Q8H  . pneumococcal 23 valent vaccine  0.5 mL Intramuscular Tomorrow-1000  . vancomycin  1,500 mg Intravenous Q24H  . [DISCONTINUED] vancomycin  1,250 mg Intravenous Q24H      . [DISCONTINUED] sodium chloride 1,000 mL (05/23/12 0245)  . [DISCONTINUED] heparin 2,450 Units/hr (05/23/12 1808)    Labs:  [facility] 05/24/12 0530 05/23/12 0505  NA 138 136  K 3.5 3.6  CL 103 98  CO2 20 20    GLUCOSE 160* 225*  BUN 38* 36*  CREATININE 1.62* 2.34*  CALCIUM 8.1* 8.0*  MG 1.9 --  PHOS 3.3 --    [facility] 05/23/12 0910  AST 94*  ALT 68*  ALKPHOS 69  BILITOT 0.4  PROT 6.6  ALBUMIN 2.5*    Basename 05/24/12 0530 05/23/12 0505 05/22/12 1933  WBC 25.9* 28.8* --  NEUTROABS -- -- 26.6*  HGB 11.3* 11.2* --  HCT 33.7* 33.1* --  MCV 91.1 90.9 --  PLT 257 295 --    Basename 05/22/12 1933  CKTOTAL --  CKMB --  TROPONINI <0.30    Basename 05/23/12 0910  HGBA1C 6.7*    Basename 05/23/12 0910  TSH 0.525  T4TOTAL --  T3FREE --  THYROIDAB --    Radiology/Studies: US Renal Port  05/23/2012  *RADIOLOGY REPORT*  Clinical Data: Acute renal failure.  RENAL/URINARY TRACT ULTRASOUND COMPLETE  Comparison:  None.  Findings:  Right Kidney:  11.6 cm in length. Normal renal cortical thickness and echogenicity without focal lesions or hydronephrosis.  Left Kidney:  11.8 cm in length. Normal renal cortical thickness and echogenicity without focal lesions or hydronephrosis.  Bladder:  Decompressed by a Foley catheter.  IMPRESSION: Normal renal ultrasound examination.  No hydronephrosis.   Original Report Authenticated By: Rudie Meyer, M.D.    Dg Chest Port 1 View  05/24/2012  *RADIOLOGY REPORT*  Clinical Data: Respiratory failure, shortness of breath  PORTABLE CHEST -  1 VIEW  Comparison: Portable chest x-ray of 05/22/2012  Findings: Mild atelectasis remains at the lung bases.  No focal infiltrate or effusion is seen.  Cardiomegaly is stable.  No bony abnormality is noted.  IMPRESSION: Persistent bibasilar linear atelectasis.  Stable cardiomegaly.   Original Report Authenticated By: Dwyane Dee, M.D.    Dg Chest Port 1 View  05/22/2012  *RADIOLOGY REPORT*  Clinical Data: Mid chest pain, shortness of breath, hypertension, diabetes  PORTABLE CHEST - 1 VIEW  Comparison: Portable exam 1918 hours compared to 11/17/2008  Findings: Exclusion of costophrenic angles especially on the left.  Upper normal heart size. Mediastinal contours and pulmonary vascularity normal. Question minimal right basilar atelectasis. Upper lungs clear. No gross pleural effusion or pneumothorax.  IMPRESSION: No definite acute abnormalities though the costophrenic angles particularly on the left are partially excluded. Consider follow-up upright PA and lateral chest radiographs if clinically indicated to better evaluate the lung bases.   Original Report Authenticated By: Ulyses Southward, M.D.     Echocardiogram:EF 60-65% LE Doppler: Negative for DVT   Telemetry:    Assessment and Plan   78 years old morbid obese male with PMH of DM, HTN, HLD, RMSF, family history only of a brother with MI  Presents with two weeks of worsening SOB, productive cough and more recently fever and chills. Over the last couple of days developed LLE erythema and swelling. At admission found to be briefly on complete heart block but now 2:1 AV block.   1. Brief period of complete heart block, transitioned to 2:1 AV block  Now 1st degree AVB today - Patient is asymptomatic  - Hold AV nodal agents ( on Metoprolol at home)  - Follow EKG  - avoid PPM in the setting of significant infections.    2. Sepsis- Panculture, Vanc and Zosyn, PCCM   3. LLL pneumonia, PCCM   4. LLE cellulitis- LE doppler ruled out DVT. Off Heparin drip   5. UTI, see above   6. Acute on chronic rena failure, better per PCCM   7. HTN, hold home meds (HCTZ, Lisinopril, Glipizide,Metoprolol)   8. DM, HB A1C 6.7.      SSI, consider adding Lantus. Per PCCM   9. Dyslipidemia   10. Morbid obesity   11. Code status:Full code   12. VTE: heparin   AVB better. TSH normal. His multiple medical problems and respiratory issues may contribute to his AVB. PCCM manage medical problems.   Cellulitis worsening and spiked fever last night while he is on Vanc and Zosyn.  No s/s Necrotizing infection from exam. Consider to increase Zosyn dosage for better  pseudomonas coverage in the setting of DM ( renal adjusted)  Will defer management to PCCM.    Signed, LI, NA PGY-2 7:32 AM   History and all data above reviewed.  Patient examined.  I agree with the findings as above.  The patient exam reveals COR: RRR  ,  Lungs: Decreased breath sounds  ,  Abd: Positive bowel sounds, no rebound no guarding, Ext Cellulitis left leg  .  All available labs, radiology testing, previous records reviewed. Agree with documented assessment and plan. Heart block resolved.  No further acute cardiac issues.  No indication for pacing.    Fayrene Fearing Caroll [place]  9:03 AM  05/24/2012

## 2012-05-24 NOTE — Progress Notes (Signed)
Transported to 2900 with Takiyah RN, monitored and O2 2L [place] and transport. NSR 90's. Tolerated exam well. NSR 90's and Sp02 93-95 3L [place]

## 2012-05-25 DIAGNOSIS — J9600 Acute respiratory failure, unspecified whether with hypoxia or hypercapnia: Secondary | ICD-10-CM

## 2012-05-25 DIAGNOSIS — A419 Sepsis, unspecified organism: Secondary | ICD-10-CM

## 2012-05-25 DIAGNOSIS — J9601 Acute respiratory failure with hypoxia: Secondary | ICD-10-CM | POA: Diagnosis present

## 2012-05-25 LAB — DIFFERENTIAL
Eosinophils Relative: 0 % (ref 0–5)
Lymphocytes Relative: 5 % — ABNORMAL LOW (ref 12–46)
Lymphs Abs: 1.3 10*3/uL (ref 0.7–4.0)
Monocytes Relative: 6 % (ref 3–12)
Neutrophils Relative %: 89 % — ABNORMAL HIGH (ref 43–77)

## 2012-05-25 LAB — URINE CULTURE: Colony Count: 35000

## 2012-05-25 LAB — BASIC METABOLIC PANEL
BUN: 31 mg/dL — ABNORMAL HIGH (ref 6–23)
CO2: 25 mEq/L (ref 19–32)
Chloride: 104 mEq/L (ref 96–112)
GFR calc non Af Amer: 54 mL/min — ABNORMAL LOW (ref >90)
Glucose, Bld: 188 mg/dL — ABNORMAL HIGH (ref 70–99)
Potassium: 3.6 mEq/L (ref 3.5–5.1)
Sodium: 141 mEq/L (ref 135–145)

## 2012-05-25 LAB — GLUCOSE, CAPILLARY
Glucose-Capillary: 172 mg/dL — ABNORMAL HIGH (ref 70–99)
Glucose-Capillary: 158 mg/dL — ABNORMAL HIGH (ref 70–99)
Glucose-Capillary: 147 mg/dL — ABNORMAL HIGH (ref 70–99)

## 2012-05-25 LAB — PHOSPHORUS: Phosphorus: 2.6 mg/dL (ref 2.3–4.6)

## 2012-05-25 LAB — CBC
HCT: 35.2 % — ABNORMAL LOW (ref 39.0–52.0)
Hemoglobin: 11.8 g/dL — ABNORMAL LOW (ref 13.0–17.0)
MCHC: 33.5 g/dL (ref 30.0–36.0)
RBC: 3.86 MIL/uL — ABNORMAL LOW (ref 4.22–5.81)
WBC: 23.6 10*3/uL — ABNORMAL HIGH (ref 4.0–10.5)

## 2012-05-25 MED ORDER — CEFAZOLIN SODIUM-DEXTROSE 2-3 GM-% IV SOLR
2.0000 g | Freq: Three times a day (TID) | INTRAVENOUS | Status: DC
  Administered 2012-05-25 – 2012-05-26 (×3): 2 g via INTRAVENOUS
  Filled 2012-05-25 (×5): qty 50

## 2012-05-25 MED ORDER — BISACODYL 10 MG RE SUPP
10.0000 mg | Freq: Once | RECTAL | Status: AC
  Administered 2012-05-25: 10 mg via RECTAL
  Filled 2012-05-25: qty 1

## 2012-05-25 MED ORDER — FUROSEMIDE 10 MG/ML IJ SOLN
INTRAMUSCULAR | Status: AC
  Filled 2012-05-25: qty 4

## 2012-05-25 MED ORDER — FLEET ENEMA 7-19 GM/118ML RE ENEM
1.0000 | ENEMA | Freq: Once | RECTAL | Status: DC
  Filled 2012-05-25: qty 1

## 2012-05-25 MED ORDER — SORBITOL 70 % SOLN
30.0000 mL | Freq: Once | Status: AC
  Administered 2012-05-25: 30 mL via ORAL
  Filled 2012-05-25: qty 30

## 2012-05-25 MED ORDER — POTASSIUM CHLORIDE CRYS ER 20 MEQ PO TBCR
40.0000 meq | EXTENDED_RELEASE_TABLET | Freq: Three times a day (TID) | ORAL | Status: AC
  Administered 2012-05-25 (×2): 40 meq via ORAL
  Filled 2012-05-25 (×2): qty 2

## 2012-05-25 MED ORDER — FUROSEMIDE 10 MG/ML IJ SOLN
20.0000 mg | Freq: Four times a day (QID) | INTRAMUSCULAR | Status: AC
  Administered 2012-05-25 – 2012-05-26 (×3): 20 mg via INTRAVENOUS

## 2012-05-25 NOTE — Progress Notes (Addendum)
[facility]    Date of Admission:  05/22/2012   Total days of antibiotics 4        Day 4 piptazo        Day 4 vanco        Day 2 clindamycin   ID: Luis Carrillo is a 78 y.o. male presents with LLE cellulitis, WBC of 30 with greater than 20% bands and lactic acidosis currently on clindamycin, vancomycin and piptazo.    Active Problems:  Acute respiratory failure    Subjective: Afebrile in the last 24 hrs, leg feels less tight  Medications:     . bisacodyl  10 mg Rectal Once  .  ceFAZolin (ANCEF) IV  2 g Intravenous Q8H  . clindamycin (CLEOCIN) IV  900 mg Intravenous Q8H  . famotidine (PEPCID) IV  20 mg Intravenous QHS  . [COMPLETED] furosemide      . furosemide  20 mg Intravenous Q6H  . heparin subcutaneous  5,000 Units Subcutaneous Q8H  . [EXPIRED] influenza  inactive virus vaccine  0.5 mL Intramuscular Tomorrow-1000  . insulin aspart  2-6 Units Subcutaneous TID WC  . magnesium hydroxide  30 mL Oral Once  . [EXPIRED] pneumococcal 23 valent vaccine  0.5 mL Intramuscular Tomorrow-1000  . potassium chloride  40 mEq Oral TID  . senna-docusate  1 tablet Oral BID  . sodium phosphate  1 enema Rectal Once  . vancomycin  1,500 mg Intravenous Q24H  . [DISCONTINUED] insulin aspart  2-6 Units Subcutaneous Q4H  . [DISCONTINUED] piperacillin-tazobactam (ZOSYN)  IV  3.375 g Intravenous Q8H    Objective: Vital signs in last 24 hours: Temp:  [97.8 F (36.6 C)-99.3 F (37.4 C)] 98.5 F (36.9 C) (11/20 0800) Pulse Rate:  [79-103] 103  (11/20 1150) Resp:  [22-32] 28  (11/20 0900) BP: (134-159)/(46-84) 154/83 mmHg (11/20 0900) SpO2:  [92 %-100 %] 93 % (11/20 0900) FiO2 (%):  [40 %] 40 % (11/20 0300) Weight:  [328 lb 14.8 oz (149.2 kg)] 328 lb 14.8 oz (149.2 kg) (11/20 0500)  Physical Exam  Constitutional: He is oriented to person, place, and time. He appears well-developed and well-nourished. No distress.  HENT:  Mouth/Throat: Oropharynx is clear and  moist. No oropharyngeal exudate.  Cardiovascular: Normal rate, regular rhythm and normal heart sounds. Exam reveals no gallop and no friction rub.  No murmur heard.  Pulmonary/Chest: Effort normal and breath sounds normal. No respiratory distress. He has no wheezes.  Abdominal: Soft. Bowel sounds are normal. He exhibits no distension. There is no tenderness.  Lymphadenopathy: no cervical adenopathy.  Neurological: He is alert and oriented to person, place, and time.  Skin: left leg erythema is slightly less, no further expansion outside of margin on his knee but slight expansion to medial aspect of thigh. Blanching. +1-2 pitting edema up to knee Psychiatric: He has a normal mood and affect. His behavior is normal.    Lab Results  [facility] 05/25/12 0510 05/24/12 0530  WBC 23.6* 25.9*  HGB 11.8* 11.3*  HCT 35.2* 33.7*  NA 141 138  K 3.6 3.5  CL 104 103  CO2 25 20  BUN 31* 38*  CREATININE 1.23 1.62*  GLU -- --   Liver Panel  Basename 05/23/12 0910  PROT 6.6  ALBUMIN 2.5*  AST 94*  ALT 68*  ALKPHOS 69  BILITOT 0.4  BILIDIR 0.2  IBILI 0.2*   Microbiology: 11/17 blood cx NGTD  11/17 urine cx: 35,000 enterobacter (R cefazolin S  ctx S bactrim) 11/18 urine cx: PENDING   Studies/Results: Mr Tibia Fibula Left Wo Contrast  05/24/2012  *RADIOLOGY REPORT*  Clinical Data: Left lower extremity cellulitis.  MRI OF THE LEFT LOWER LEG WITHOUT CONTRAST  Technique:  Multiplanar, multisequence MR imaging of the left lower leg was performed.  No intravenous contrast was administered.  Comparison:   None  Findings:  There is circumferential subcutaneous edema in the left lower leg.  However, there is no myositis or osteomyelitis.  There is no fluid along the deep fascial planes.  There is no subcutaneous abscess.  No appreciable ankle joint or knee joint effusions.  IMPRESSION: Diffuse subcutaneous edema consistent with cellulitis.  No myositis, osteomyelitis, or abscess.   Original Report  Authenticated By: Francene Boyers, M.D.    US Renal Port  05/23/2012  *RADIOLOGY REPORT*  Clinical Data: Acute renal failure.  RENAL/URINARY TRACT ULTRASOUND COMPLETE  Comparison:  None.  Findings:  Right Kidney:  11.6 cm in length. Normal renal cortical thickness and echogenicity without focal lesions or hydronephrosis.  Left Kidney:  11.8 cm in length. Normal renal cortical thickness and echogenicity without focal lesions or hydronephrosis.  Bladder:  Decompressed by a Foley catheter.  IMPRESSION: Normal renal ultrasound examination.  No hydronephrosis.   Original Report Authenticated By: Rudie Meyer, M.D.    Dg Chest Port 1 View  05/24/2012  *RADIOLOGY REPORT*  Clinical Data: Respiratory failure, shortness of breath  PORTABLE CHEST - 1 VIEW  Comparison: Portable chest x-ray of 05/22/2012  Findings: Mild atelectasis remains at the lung bases.  No focal infiltrate or effusion is seen.  Cardiomegaly is stable.  No bony abnormality is noted.  IMPRESSION: Persistent bibasilar linear atelectasis.  Stable cardiomegaly.   Original Report Authenticated By: Dwyane Dee, M.D.    Assessment/Plan: 1) cellulitis = likely due to strep or staph. Since WBC continues to improve, will switch piptazo to cefazolin and discontinue vanco since no cx positive for MRSA . Worsening erythema is likely due to response to antibiotics. Agree with keeping clindamycin for a total of 3days for toxin production. Leg has been outlined to see that he is responding to therapy. Please check cbc with diff in the am   2) bacturia= initial ur cx only has  35,000 enterobacter. Likely red herring to his clinical picture. His presentation and severity of illness is due to cellulitis and not likely uti. Patient denies any dysuria. Would not recommend to treat.   Drue Second [facility] Cell: [phone] Pager: [phone]  05/25/2012, 12:34 PM

## 2012-05-25 NOTE — Progress Notes (Signed)
Rehab Admissions Coordinator Note:  Patient was screened by Trish Mage for appropriateness for an Inpatient Acute Rehab Consult.  At this time, we are recommending Skilled Nursing Facility.  I spoke with the on site reviewer for [facility].  Given current diagnosis, patient would not get approval for an inpatient rehab admission.  Call me for questions.  Trish Mage 05/25/2012, 12:20 PM  I can be reached at [phone].

## 2012-05-25 NOTE — Evaluation (Signed)
Physical Therapy Evaluation Patient Details Name: Heather Streeper MRN: [id number] DOB: December 25, 1930 Today's Date: 05/25/2012 Time: 4098-1191 PT Time Calculation (min): 16 min  PT Assessment / Plan / Recommendation Clinical Impression  Mr. Hollingsworth is 78 y/o male admittted with two weeks of worsening SOB, productive cough and more recently fever and chills. Over the last couple of days developed LLE erythema and swelling. Does not recall trauma but there is a healing wound in the LLE.  Presents to PT today with limited activity tolerance in addition to generalized weakness limiting his independence with gait and transfers and affecting pt PLOF. Will benefit physical therapy in the acute setting to maxmize mobility for safe d/c plan.     PT Assessment  Patient needs continued PT services    Follow Up Recommendations  CIR    Does the patient have the potential to tolerate intense rehabilitation      Barriers to Discharge Decreased caregiver support wife can only provide supervision level support    Equipment Recommendations  Rolling walker with 5" wheels    Recommendations for Other Services Rehab consult   Frequency Min 3X/week    Precautions / Restrictions Precautions Precautions: Fall Precaution Comments: impulsive Restrictions Weight Bearing Restrictions: No   Pertinent Vitals/Pain DOE noted 2-3/4 during ambulation, becomes more impulsive with shortness of breath      Mobility  Bed Mobility Bed Mobility: Not assessed Details for Bed Mobility Assistance: pt reports increased difficulty with getting OOB Transfers Transfers: Sit to Stand;Stand to Sit Sit to Stand: 1: +2 Total assist;With upper extremity assist;From chair/3-in-1;With armrests Sit to Stand: Patient Percentage: 60% Stand to Sit: 3: Mod assist;With upper extremity assist;To chair/3-in-1;With armrests Details for Transfer Assistance: Initial sit->stand pt moving quickly and lost his balance backward and sat very  quickly into the chair; 2nd attempt pt using momentum of dropping lower extremities and thrusting trunk forward, facilitation to bring trunk anteriorly over BOS and to prevent posterior LOB ; assist to control descent Ambulation/Gait Ambulation/Gait Assistance: 4: Min assist Ambulation Distance (Feet): 30 Feet Assistive device: Rolling walker Ambulation/Gait Assistance Details: cues for tall posture and safe maneuvering with RW especially with turns and as pt fatigued he became more impulsive Gait Pattern: Trunk flexed Stairs: No Wheelchair Mobility Wheelchair Mobility: No              PT Diagnosis: Difficulty walking;Abnormality of gait;Generalized weakness;Acute pain  PT Problem List: Decreased strength;Decreased activity tolerance;Decreased balance;Decreased mobility;Decreased safety awareness;Decreased knowledge of use of DME;Pain;Decreased skin integrity;Cardiopulmonary status limiting activity PT Treatment Interventions: DME instruction;Gait training;Stair training;Functional mobility training;Therapeutic activities;Therapeutic exercise;Balance training;Neuromuscular re-education;Patient/family education   PT Goals Acute Rehab PT Goals PT Goal Formulation: With patient Time For Goal Achievement: 06/01/12 Potential to Achieve Goals: Good Pt will go Supine/Side to Sit: with modified independence PT Goal: Supine/Side to Sit - Progress: Goal set today Pt will go Sit to Supine/Side: with modified independence PT Goal: Sit to Supine/Side - Progress: Goal set today Pt will go Sit to Stand: with modified independence PT Goal: Sit to Stand - Progress: Goal set today Pt will go Stand to Sit: with modified independence PT Goal: Stand to Sit - Progress: Goal set today Pt will Transfer Bed to Chair/Chair to Bed: with modified independence PT Transfer Goal: Bed to Chair/Chair to Bed - Progress: Goal set today Pt will Stand: Independently;3 - 5 min;with no upper extremity support PT Goal:  Stand - Progress: Goal set today Pt will Ambulate: 51 - 150 feet;with modified independence;with least restrictive  assistive device PT Goal: Ambulate - Progress: Goal set today Pt will Go Up / Down Stairs: 3-5 stairs;with min assist;with least restrictive assistive device PT Goal: Up/Down Stairs - Progress: Goal set today Pt will Perform Home Exercise Program: Independently PT Goal: Perform Home Exercise Program - Progress: Goal set today  Visit Information  Last PT Received On: 05/25/12 Assistance Needed: +2    Subjective Data  Subjective: Im just so weak. I need to get a motorized wheelchair.  Patient Stated Goal: home   Prior Functioning  Home Living Lives With: Spouse Available Help at Discharge: Family;Available 24 hours/day (supervision only) Type of Home: House Home Access: Stairs to enter [employer] of Steps: 3 Entrance Stairs-Rails: None Home Layout: One level Bathroom Shower/Tub: Advice worker with back Prior Function Level of Independence: Independent Able to Take Stairs?: Yes Driving: Yes Vocation: Retired Musician: No difficulties    Cognition  Overall Cognitive Status: Impaired Area of Impairment: Safety/judgement Arousal/Alertness: Awake/alert Orientation Level: Appears intact for tasks assessed Behavior During Session: WFL for tasks performed Safety/Judgement: Impulsive    Extremity/Trunk Assessment Right Upper Extremity Assessment RUE ROM/Strength/Tone: WFL for tasks assessed Left Upper Extremity Assessment LUE ROM/Strength/Tone: WFL for tasks assessed Right Lower Extremity Assessment RLE ROM/Strength/Tone: Deficits RLE ROM/Strength/Tone Deficits: generalized weakness grossly 4/5 Left Lower Extremity Assessment LLE ROM/Strength/Tone: Deficits LLE ROM/Strength/Tone Deficits: generalized weakness grossly 4/5   Balance Balance Balance Assessed:  Yes Static Standing Balance Static Standing - Balance Support: Bilateral upper extremity supported Static Standing - Level of Assistance: 4: Min assist Static Standing - Comment/# of Minutes: initially pt with loss of balance backward into chair, with facilitation and verbal cues pt able to stabilize in standing with bilateral support and minA for stability  End of Session PT - End of Session Equipment Utilized During Treatment: Gait belt Activity Tolerance: Patient limited by fatigue;Patient tolerated treatment well Patient left: in chair;with call bell/phone within reach  GP     [facility] HELEN 05/25/2012, 12:13 PM

## 2012-05-25 NOTE — Progress Notes (Signed)
Name: Luis Carrillo MRN: [id number] DOB: [birth year]    LOS: 3  PULMONARY / CRITICAL CARE MEDICINE  HPI:  78 years old morbid obese male with PMH relevant for DM, HTN, dyslipidemia and chronic pain on narcotics. Presents with two weeks of worsening SOB, productive cough and more recently fever and chills. Over the last couple of days developed LLE erythema and swelling. Does not recall trauma but there is a healing wound in the LLE. At the time of my exam awake, alert, oriented x3 and hemodynamically stable. At admission found to be briefly on complete heart block but now 2:1 AV block. Cardiology was consulted. UA found consistent with UTI. Denies N/V/D, urinary symptoms. No CP.  Current Status:   Vital Signs: Temp:  [97.8 F (36.6 C)-99.3 F (37.4 C)] 98.5 F (36.9 C) (11/20 0800) Pulse Rate:  [77-92] 84  (11/20 0900) Resp:  [22-32] 28  (11/20 0900) BP: (127-159)/(46-84) 154/83 mmHg (11/20 0900) SpO2:  [92 %-100 %] 93 % (11/20 0900) FiO2 (%):  [40 %] 40 % (11/20 0300) Weight:  [149.2 kg (328 lb 14.8 oz)] 149.2 kg (328 lb 14.8 oz) (11/20 0500)  Physical Examination: General:  Pleasant male patient in moderate respiratory distress, requesting to be placed back on BiPAP due to SOB and increase work of breathing. Neuro:  Awake, alert, oriented x 3, nonfocal HEENT:  PERRL, pink conjunctivae, moist membranes Neck:  Supple, no JVD   Cardiovascular:  RRR, no M/R/G Lungs:  Bilateral diminished air entry, no W/R/R Abdomen:  Soft, nontender, nondistended, bowel sounds present Musculoskeletal:  Moves all extremities, pedal edema present Skin:  LLE erythema and increased temperature, 1 cm superficial wound healing.  ASSESSMENT AND PLAN 1) Sepsis 2) Possible pneumonia 3) LLE cellulitis, will rule out DVT. 4) UTI 5) Brief period of complete heart block, now 2:1 AV block 6) HTN 7) DM 8) Dyslipidemia 9) Morbid obesity.  PULMONARY  Lab 05/23/12 1041  PHART 7.407  PCO2ART 34.2*  PO2ART  83.0  HCO3 21.5  O2SAT 96.0   Ventilator Settings: Vent Mode:  [-] PCV FiO2 (%):  [40 %] 40 % Set Rate:  [8 bmp] 8 bmp PEEP:  [6 cmH20] 6 cmH20 CXR:  Blurring of the left hemidiaphragm suggestive of LLL pneumonia.  A:  1) Possible LLL pneumonia, doubtful. P:   1) Will treat with Zosyn, clinda and Vancomycin. 2) Back on BiPAP now for increased work of breathing.  CARDIOVASCULAR  Lab 05/23/12 0505 05/22/12 2033 05/22/12 1933  TROPONINI -- -- <0.30  LATICACIDVEN 2.4* 4.2* --  PROBNP -- -- --   ECG:  Last EKG on NSR, RBBB Lines: Peripheral lines  Intake/Output Summary (Last 24 hours) at 05/25/12 1002 Last data filed at 05/25/12 0900  Gross per 24 hour  Intake   1455 ml  Output   2250 ml  Net   -795 ml   A:  1) Sepsis 2) Lactic acidosis 3) Brief episode of complete heart block and 2:1 AV block 4) LLE cellulitis and edema P:  1) KVO IVF and hold lasix given renal function. 2) Antibiotics as above. 3) We will continue to hold antihypertensive medications given rapid fluctuation in BP from 100 to 150 depending on level of agitation. 4) LE venous doppler to rule out DVT negative. 5) D/Ced heparin drip. 6) Cardiology signed off, appreciate input.  RENAL  Lab 05/25/12 0510 05/24/12 0530 05/23/12 0505 05/22/12 1948  NA 141 138 136 136  K 3.6 3.5 -- --  CL 104 103  98 103  CO2 25 20 20  --  BUN 31* 38* 36* 40*  CREATININE 1.23 1.62* 2.34* 1.90*  CALCIUM 8.6 8.1* 8.0* --  MG 2.2 1.9 -- --  PHOS 2.6 3.3 -- --   Intake/Output      11/19 0701 - 11/20 0700 11/20 0701 - 11/21 0700   P.O. 240 480   I.V. (mL/kg) 140 (0.9)    IV Piggyback 900 275   Total Intake(mL/kg) 1280 (8.6) 755 (5.1)   Urine (mL/kg/hr) 2050 (0.6) 200   Total Output 2050 200   Net -770 +555          Intake/Output Summary (Last 24 hours) at 05/25/12 1000 Last data filed at 05/25/12 0900  Gross per 24 hour  Intake   1455 ml  Output   2250 ml  Net   -795 ml   Foley:  05/22/12  A:   1) Acute  renal failure with BUN:Cr dropping with hydration but Cr rising. P:   1) KVO IVF. 2) Monitor chemistry in am. 3) Low dose lasix as ordered now that renal function is improving since patient is fluid overloaded. 4) BMET in AM. 5) Replace K.  GASTROINTESTINAL  Lab 05/23/12 0910  AST 94*  ALT 68*  ALKPHOS 69  BILITOT 0.4  PROT 6.6  ALBUMIN 2.5*   A:   1) No issues P:   - GI prophylaxis with pepcid. - LFTs in AM.  HEMATOLOGIC  Lab 05/25/12 0510 05/24/12 0530 05/23/12 0505 05/22/12 2033 05/22/12 1948 05/22/12 1933  HGB 11.8* 11.3* 11.2* -- 12.9* 12.4*  HCT 35.2* 33.7* 33.1* -- 38.0* 35.5*  PLT 282 257 295 -- -- 350  INR -- -- -- 1.21 -- --  APTT -- -- -- 35 -- --   A:   1) No issues  INFECTIOUS  Lab 05/25/12 0510 05/24/12 0530 05/23/12 0505 05/22/12 1933  WBC 23.6* 25.9* 28.8* 30.2*  PROCALCITON -- -- -- --   Cultures: Blood 11/17>>> NTD Sputum 11/17 NTD Urine 11/17>>>GNR speciation pending.  Antibiotics: Zosyn and Vancomycin 05/22/12 Clinda added 11/19  A:   1) Sepsis - Possible pneumonia - LLE cellulitis - UTI P:   - Antibiotics as above, will narrow once Cx results - Wound care consult for expanding cellulitis. - ID following, appreciate input.  ENDOCRINE  Lab 05/25/12 0753 05/24/12 2332 05/24/12 1927 05/24/12 1645 05/24/12 1139  GLUCAP 172* 165* 162* 145* 121*   A:   1) DM P:   - Insulin sliding scale  NEUROLOGIC  A:   1) No issues  BEST PRACTICE / DISPOSITION - Level of Care: ICU - Primary Service:  PCCM - Consultants:  Cardiology - Code Status:  Full code - Diet:  Clear liquids - DVT Px:  Heparin drip - GI Px:  Pepcid - Skin Integrity:  LLE cellulitis - Social / Family:  Family updated at bedside.  Rapidly developing respiratory failure, IVF overloaded and PNA in addition to body habitus and SIRS all contributing, concern for intubation is that we will not be able to get him off the ventilator.  Will continue BiPAP for now.  Will  begin diureses today with the hope that we are able to improve the respiratory situation.  I spoke with the daughter at length today, informed that we will call CSW for placement (patient will not be capable of independent living) and encouraged her to address code status with the patient.  The patient is critically ill with multiple organ systems failure and  requires high complexity decision making for assessment and support, frequent evaluation and titration of therapies, application of advanced monitoring technologies and extensive interpretation of multiple databases.   CC time 35 min.  Alyson Reedy, M.D. [facility] Pulmonary/Critical Care Medicine. Pager: [phone]. After hours pager: [phone].

## 2012-05-25 NOTE — Progress Notes (Signed)
Patient: Luis Carrillo Date of Encounter: 05/25/2012, 6:49 AM Admit date: 05/22/2012     Subjective  Patient denies chest pain, chest pressure, dizziness or lightheadedness. Still c/o SOB managed by PCCM.   Objective  Physical Exam: Vitals: BP 139/46  Pulse 85  Temp 99.3 F (37.4 C) (Oral)  Resp 28  Ht 5\' 9"  (1.753 m)  Wt 328 lb 14.8 oz (149.2 kg)  BMI 48.57 kg/m2  SpO2 92% General: Pleasant male patient in mild respiratory distress  Neuro: Awake, alert, oriented x 3, nonfocal  HEENT: PERRL, pink conjunctivae, moist membranes  Neck: Supple, unable to assess JVD due to body habitus  Cardiovascular: RRR, no M/R/G  Lungs: Bilateral diminished air entry, no W/R/R  Abdomen: Soft, nontender, nondistended, bowel sounds present  Musculoskeletal: Moves all extremities, pedal edema present  Skin: LLE erythema and increased temperature, 1 cm superficial wound healing. No change comparing yesterday  Intake/Output:  Intake/Output Summary (Last 24 hours) at 05/25/12 0649 Last data filed at 05/25/12 0400  Gross per 24 hour  Intake   1230 ml  Output   2050 ml  Net   -820 ml    Inpatient Medications:     . clindamycin (CLEOCIN) IV  900 mg Intravenous Q8H  . famotidine (PEPCID) IV  20 mg Intravenous QHS  . heparin subcutaneous  5,000 Units Subcutaneous Q8H  . influenza  inactive virus vaccine  0.5 mL Intramuscular Tomorrow-1000  . insulin aspart  2-6 Units Subcutaneous TID WC  . magnesium hydroxide  30 mL Oral Once  . piperacillin-tazobactam (ZOSYN)  IV  3.375 g Intravenous Q8H  . pneumococcal 23 valent vaccine  0.5 mL Intramuscular Tomorrow-1000  . senna-docusate  1 tablet Oral BID  . vancomycin  1,500 mg Intravenous Q24H  . [DISCONTINUED] insulin aspart  2-6 Units Subcutaneous Q4H      Labs:  [facility] 05/25/12 0510 05/24/12 0530  NA 141 138  K 3.6 3.5  CL 104 103  CO2 25 20  GLUCOSE 188* 160*  BUN 31* 38*  CREATININE 1.23 1.62*  CALCIUM 8.6 8.1*  MG 2.2 1.9    PHOS 2.6 3.3    Basename 05/23/12 0910  AST 94*  ALT 68*  ALKPHOS 69  BILITOT 0.4  PROT 6.6  ALBUMIN 2.5*    Basename 05/25/12 0510 05/24/12 0530 05/22/12 1933  WBC 23.6* 25.9* --  NEUTROABS -- -- 26.6*  HGB 11.8* 11.3* --  HCT 35.2* 33.7* --  MCV 91.2 91.1 --  PLT 282 257 --    Basename 05/22/12 1933  CKTOTAL --  CKMB --  TROPONINI <0.30    Basename 05/23/12 0910  HGBA1C 6.7*    Basename 05/23/12 0910  TSH 0.525  T4TOTAL --  T3FREE --  THYROIDAB --    Radiology/Studies: Mr Tibia Fibula Left Wo Contrast  05/24/2012  *RADIOLOGY REPORT*  Clinical Data: Left lower extremity cellulitis.  MRI OF THE LEFT LOWER LEG WITHOUT CONTRAST  Technique:  Multiplanar, multisequence MR imaging of the left lower leg was performed.  No intravenous contrast was administered.  Comparison:   None  Findings:  There is circumferential subcutaneous edema in the left lower leg.  However, there is no myositis or osteomyelitis.  There is no fluid along the deep fascial planes.  There is no subcutaneous abscess.  No appreciable ankle joint or knee joint effusions.  IMPRESSION: Diffuse subcutaneous edema consistent with cellulitis.  No myositis, osteomyelitis, or abscess.   Original Report Authenticated By: Francene Boyers, M.D.  US Renal Port  05/23/2012  *RADIOLOGY REPORT*  Clinical Data: Acute renal failure.  RENAL/URINARY TRACT ULTRASOUND COMPLETE  Comparison:  None.  Findings:  Right Kidney:  11.6 cm in length. Normal renal cortical thickness and echogenicity without focal lesions or hydronephrosis.  Left Kidney:  11.8 cm in length. Normal renal cortical thickness and echogenicity without focal lesions or hydronephrosis.  Bladder:  Decompressed by a Foley catheter.  IMPRESSION: Normal renal ultrasound examination.  No hydronephrosis.   Original Report Authenticated By: Rudie Meyer, M.D.    Dg Chest Port 1 View  05/24/2012  *RADIOLOGY REPORT*  Clinical Data: Respiratory failure, shortness of  breath  PORTABLE CHEST - 1 VIEW  Comparison: Portable chest x-ray of 05/22/2012  Findings: Mild atelectasis remains at the lung bases.  No focal infiltrate or effusion is seen.  Cardiomegaly is stable.  No bony abnormality is noted.  IMPRESSION: Persistent bibasilar linear atelectasis.  Stable cardiomegaly.   Original Report Authenticated By: Dwyane Dee, M.D.    Dg Chest Port 1 View  05/22/2012  *RADIOLOGY REPORT*  Clinical Data: Mid chest pain, shortness of breath, hypertension, diabetes  PORTABLE CHEST - 1 VIEW  Comparison: Portable exam 1918 hours compared to 11/17/2008  Findings: Exclusion of costophrenic angles especially on the left. Upper normal heart size. Mediastinal contours and pulmonary vascularity normal. Question minimal right basilar atelectasis. Upper lungs clear. No gross pleural effusion or pneumothorax.  IMPRESSION: No definite acute abnormalities though the costophrenic angles particularly on the left are partially excluded. Consider follow-up upright PA and lateral chest radiographs if clinically indicated to better evaluate the lung bases.   Original Report Authenticated By: Ulyses Southward, M.D.     Echocardiogram:EF 60-65%  LE Doppler: Negative for DVT   Telemetry: NSR with 1st degree AVB   Assessment and Plan   78 years old morbid obese male with PMH of DM, HTN, HLD, RMSF, family history only of a brother with MI  Presents with two weeks of worsening SOB, productive cough and more recently fever and chills. Over the last couple of days developed LLE erythema and swelling. At admission found to be briefly on complete heart block but now 2:1 AV block.   1. Brief period of complete heart block, transitioned to 2:1 AV block . TSH normal Now 1st degree AVB x 2 days - Patient is asymptomatic  - Hold AV nodal agents ( on Metoprolol at home)  - Follow EKG  - avoid PPM in the setting of significant infections.   2. Sepsis- Panculture, Clinda, Vanc and Zosyn, PCCM and ID   3. LLL  pneumonia, PCCM   4. LLE cellulitis- LE doppler ruled out DVT. Off Heparin drip   5. UTI, see above   6. Acute on chronic rena failure, better per PCCM   7. HTN, hold home meds (HCTZ, Lisinopril, Glipizide,Metoprolol)   8. DM, HB A1C 6.7.   SSI, consider adding Lantus. Per PCCM   9. Dyslipidemia   10. Morbid obesity   11. Code status:Full code   12. VTE: heparin   Heart block resolved, NSR with 1st degree AVB x 2 days. His multiple medical problems and respiratory issues may contribute to his AVB. PCCM manage medical problems  Will consider to sign off now. Please call if needed. Otherwise, patient can follow up with [facility] in 1-2 weeks after discharge.    Signed, LI, NA PGY-2 6:54 AM  Agree with above assessment and plan. EKG this am shows normal PR interval and RBBB.  No chest pain.  Still dyspneic with expiratory wheezing. Heart tones distant. Will sign off now but would like to see in office one time post discharge.

## 2012-05-26 ENCOUNTER — Inpatient Hospital Stay (HOSPITAL_COMMUNITY): Payer: Medicare Other

## 2012-05-26 LAB — BASIC METABOLIC PANEL
CO2: 29 mEq/L (ref 19–32)
GFR calc non Af Amer: 53 mL/min — ABNORMAL LOW (ref >90)
Glucose, Bld: 175 mg/dL — ABNORMAL HIGH (ref 70–99)
Potassium: 3.9 mEq/L (ref 3.5–5.1)
Sodium: 145 mEq/L (ref 135–145)

## 2012-05-26 LAB — HEPATIC FUNCTION PANEL
ALT: 78 U/L — ABNORMAL HIGH (ref 0–53)
Indirect Bilirubin: 0.3 mg/dL (ref 0.3–0.9)
Total Protein: 7.2 g/dL (ref 6.0–8.3)

## 2012-05-26 LAB — CBC
Hemoglobin: 12.3 g/dL — ABNORMAL LOW (ref 13.0–17.0)
MCHC: 34 g/dL (ref 30.0–36.0)
RBC: 3.96 MIL/uL — ABNORMAL LOW (ref 4.22–5.81)

## 2012-05-26 LAB — GLUCOSE, CAPILLARY
Glucose-Capillary: 151 mg/dL — ABNORMAL HIGH (ref 70–99)
Glucose-Capillary: 119 mg/dL — ABNORMAL HIGH (ref 70–99)

## 2012-05-26 LAB — PHOSPHORUS: Phosphorus: 2.5 mg/dL (ref 2.3–4.6)

## 2012-05-26 MED ORDER — CEFAZOLIN SODIUM-DEXTROSE 2-3 GM-% IV SOLR
2.0000 g | Freq: Three times a day (TID) | INTRAVENOUS | Status: DC
  Administered 2012-05-26 – 2012-05-27 (×2): 2 g via INTRAVENOUS
  Filled 2012-05-26 (×3): qty 50

## 2012-05-26 MED ORDER — LEVOFLOXACIN 750 MG PO TABS
750.0000 mg | ORAL_TABLET | Freq: Every day | ORAL | Status: DC
  Filled 2012-05-26: qty 1

## 2012-05-26 MED ORDER — FAMOTIDINE 20 MG PO TABS
20.0000 mg | ORAL_TABLET | Freq: Every day | ORAL | Status: DC
  Administered 2012-05-26 – 2012-05-30 (×5): 20 mg via ORAL
  Filled 2012-05-26 (×7): qty 1

## 2012-05-26 MED ORDER — BISACODYL 10 MG RE SUPP: 10.0000 mg | Freq: Every day | RECTAL | Status: DC | PRN

## 2012-05-26 MED ORDER — FUROSEMIDE 10 MG/ML IJ SOLN
INTRAMUSCULAR | Status: AC
  Filled 2012-05-26: qty 4

## 2012-05-26 MED ORDER — CIPROFLOXACIN HCL 500 MG PO TABS
500.0000 mg | ORAL_TABLET | Freq: Two times a day (BID) | ORAL | Status: DC
  Administered 2012-05-26: 500 mg via ORAL
  Filled 2012-05-26 (×2): qty 1

## 2012-05-26 MED ORDER — CLINDAMYCIN PHOSPHATE 900 MG/50ML IV SOLN
900.0000 mg | Freq: Three times a day (TID) | INTRAVENOUS | Status: DC
  Filled 2012-05-26 (×2): qty 50

## 2012-05-26 MED ORDER — CLINDAMYCIN PHOSPHATE 900 MG/50ML IV SOLN
900.0000 mg | Freq: Three times a day (TID) | INTRAVENOUS | Status: AC
  Administered 2012-05-26 (×2): 900 mg via INTRAVENOUS
  Filled 2012-05-26 (×3): qty 50

## 2012-05-26 NOTE — Progress Notes (Signed)
Name: Luis Carrillo MRN: [id number] DOB: 1930-11-06    LOS: 4  PULMONARY / CRITICAL CARE MEDICINE  HPI:  78 years old morbid obese male with PMH relevant for DM, HTN, dyslipidemia and chronic pain on narcotics. Presents with two weeks of worsening SOB, productive cough and more recently fever and chills. Over the last couple of days developed LLE erythema and swelling. Does not recall trauma but there is a healing wound in the LLE. At the time of my exam awake, alert, oriented x3 and hemodynamically stable. At admission found to be briefly on complete heart block but now 2:1 AV block. Cardiology was consulted. UA found consistent with UTI. Denies N/V/D, urinary symptoms. No CP.  Current Status: BP good, had neg balance, no distress  Vital Signs: Temp:  [97.5 F (36.4 C)-98.3 F (36.8 C)] 97.7 F (36.5 C) (11/21 0800) Pulse Rate:  [70-103] 74  (11/21 1000) BP: (148-166)/(65-90) 153/70 mmHg (11/21 1000) SpO2:  [95 %-99 %] 96 % (11/21 1000)  Physical Examination: General:  Pleasant male patient in no distress,Sitting in bedside chair. Neuro:  Awake, alert, oriented x 3, conversing with visitors.  Appropriate. HEENT:  PERRL, pink conjunctivae, moist membranes Neck:  Supple, no JVD   Cardiovascular:  RRR, no M/R/G Lungs:  Bilateral diminished air entry, Some slight wheezing heard R lung fields. Abdomen:  Soft, nontender, nondistended, bowel sounds present Musculoskeletal:  Moves all extremities, pedal edema present Skin:  LLE erythema and increased temperature, dressing noted LLE intact with dried drainage.  ASSESSMENT AND PLAN 1) Sepsis 2) Possible pneumonia 3) LLE cellulitis, will rule out DVT. 4) UTI 5) Brief period of complete heart block, now 2:1 AV block 6) HTN 7) DM 8) Dyslipidemia 9) Morbid obesity.  PULMONARY  Lab 05/23/12 1041  PHART 7.407  PCO2ART 34.2*  PO2ART 83.0  HCO3 21.5  O2SAT 96.0    CXR:11/21 Bibasilar atelectasis verses airspace disease is worse when  compared to x-ray on 11/19  A:  1) Possible LLL pneumonia, doubtful. Pt reports refusing Bipap for the last 2 days P:   1) ABX 2) Encourage pt to be up in chair IS tolerated neg balance, may need further  CARDIOVASCULAR  Lab 05/23/12 0505 05/22/12 2033 05/22/12 1933  TROPONINI -- -- <0.30  LATICACIDVEN 2.4* 4.2* --  PROBNP -- -- --   ECG:  Last EKG on NSR, RBBB Lines: Peripheral lines  Intake/Output Summary (Last 24 hours) at 05/26/12 1108 Last data filed at 05/26/12 0600  Gross per 24 hour  Intake   1076 ml  Output   2400 ml  Net  -1324 ml   A:  1) Sepsis 2) Lactic acidosis 3) Brief episode of complete heart block and 2:1 AV block 4) LLE cellulitis and edema P:  1) KVO IVF. 2) Antibiotics as above. 3) We will continue to hold antihypertensive medications given rapid fluctuation in BP from 100 to 150 depending on level of agitation. If needed would use hydralazine  RENAL  Lab 05/26/12 0430 05/25/12 0510 05/24/12 0530 05/23/12 0505 05/22/12 1948  NA 145 141 138 136 136  K 3.9 3.6 -- -- --  CL 105 104 103 98 103  CO2 29 25 20 20  --  BUN 30* 31* 38* 36* 40*  CREATININE 1.25 1.23 1.62* 2.34* 1.90*  CALCIUM 8.8 8.6 8.1* 8.0* --  MG 2.0 2.2 1.9 -- --  PHOS 2.5 2.6 3.3 -- --   Intake/Output      11/20 0701 - 11/21 0700 11/21 0701 -  11/22 0700   P.O. 1200    I.V. (mL/kg) 70 (0.5)    IV Piggyback 581    Total Intake(mL/kg) 1851 (12.4)    Urine (mL/kg/hr) 2600 (0.7)    Total Output 2600    Net -749           Intake/Output Summary (Last 24 hours) at 05/26/12 1108 Last data filed at 05/26/12 0600  Gross per 24 hour  Intake   1076 ml  Output   2400 ml  Net  -1324 ml   Foley:  05/22/12  A:   1) Acute renal failure with BUN:Cr dropping with hydration but Cr rising. P:   1) KVO IVF. 2) Monitor chemistry in am. Re assess lasix needs in am  Even goal  GASTROINTESTINAL  Lab 05/26/12 0430 05/23/12 0910  AST 63* 94*  ALT 78* 68*  ALKPHOS 82 69  BILITOT  0.4 0.4  PROT 7.2 6.6  ALBUMIN 2.3* 2.5*   A:   1) No BM P:   - Doculax ordered - GI prophylaxis with pepcid. - LFTs in AM.  HEMATOLOGIC  Lab 05/26/12 0430 05/25/12 0510 05/24/12 0530 05/23/12 0505 05/22/12 2033 05/22/12 1948 05/22/12 1933  HGB 12.3* 11.8* 11.3* 11.2* -- 12.9* --  HCT 36.2* 35.2* 33.7* 33.1* -- 38.0* --  PLT 313 282 257 295 -- -- 350  INR -- -- -- -- 1.21 -- --  APTT -- -- -- -- 35 -- --   A:   1) DVt prevention Wbc better  INFECTIOUS  Lab 05/26/12 0430 05/25/12 0510 05/24/12 0530 05/23/12 0505 05/22/12 1933  WBC 19.9* 23.6* 25.9* 28.8* 30.2*  PROCALCITON -- -- -- -- --   Cultures: Blood 11/17>>> NTD Sputum 11/17>>> NTD Urine 11/17>>>ENTEROBACTER CLOACAE.  Antibiotics: Zosyn and Vancomycin 05/22/12>>>11/21 Clinda added 11/19>>>11/21 Cipro 11/21>>>  A:   1) Sepsis - Possible pneumonia(doubtful) - LLE cellulitis - UTI P:   - Antibiotics narrow to Cipro - Wound care consult for expanding cellulitis. - ID following, appreciate input. -consider dc clinda, today would be day 3  ENDOCRINE  Lab 05/25/12 2145 05/25/12 1625 05/25/12 1117 05/25/12 0753 05/24/12 2332  GLUCAP 156* 147* 158* 172* 165*   A:   1) DM P:   - Insulin sliding scale  NEUROLOGIC  A:   1) IS, PT  BEST PRACTICE / DISPOSITION - Level of Care: ICU to floor - Primary Service:  PCCM to traid - Consultants:  Cardiology - Code Status:  Full code - Diet:  Clear liquids - DVT Px:  Heparin sub q  - GI Px:  Pepcid - Skin Integrity:  LLE cellulitis - Social / Family:   Pt leukocytosis id improving and reportedly he has refused BiPAP the last two nights.  Pt is sitting up in chair at the bedside. Transfer PT to tele under the [facility] hospitalist service.   Richard Raynor SNP  I have fully examined this patient and agree with above findings.    And edited in full  Mcarthur Rossetti. Tyson Alias, MD, FACP Pgr: [phone] [facility]

## 2012-05-26 NOTE — Progress Notes (Signed)
[facility]    Date of Admission:  05/22/2012   Total days of antibiotics 5        Day 3 clindamycin        Day 2 cefazolin        (previously on vanco, piptazo, 1 dose of cipro)   ID: Luis Carrillo is a 78 y.o. male presents with LLE cellulitis, WBC of 30 with greater than 20% bands and lactic acidosis currently on clindamycin, vancomycin and piptazo.    Active Problems:  Acute respiratory failure    Subjective: Afebrile, leg feels less tight, still somewhat sensitive but not as erythematous  Medications:     .  ceFAZolin (ANCEF) IV  2 g Intravenous Q8H  . clindamycin (CLEOCIN) IV  900 mg Intravenous Q8H  . famotidine  20 mg Oral QHS  . [COMPLETED] furosemide      . [COMPLETED] furosemide      . [COMPLETED] furosemide  20 mg Intravenous Q6H  . heparin subcutaneous  5,000 Units Subcutaneous Q8H  . insulin aspart  2-6 Units Subcutaneous TID WC  . magnesium hydroxide  30 mL Oral Once  . [COMPLETED] potassium chloride  40 mEq Oral TID  . senna-docusate  1 tablet Oral BID  . sodium phosphate  1 enema Rectal Once  . [COMPLETED] sorbitol  30 mL Oral Once  . [DISCONTINUED]  ceFAZolin (ANCEF) IV  2 g Intravenous Q8H  . [DISCONTINUED] ciprofloxacin  500 mg Oral BID  . [DISCONTINUED] clindamycin (CLEOCIN) IV  900 mg Intravenous Q8H  . [DISCONTINUED] clindamycin (CLEOCIN) IV  900 mg Intravenous Q8H  . [DISCONTINUED] famotidine (PEPCID) IV  20 mg Intravenous QHS  . [DISCONTINUED] levofloxacin  750 mg Oral Q2000  . [DISCONTINUED] vancomycin  1,500 mg Intravenous Q24H    Objective: Vital signs in last 24 hours: Temp:  [97.6 F (36.4 C)-98.3 F (36.8 C)] 97.7 F (36.5 C) (11/21 1600) Pulse Rate:  [73-95] 88  (11/21 1600) BP: (149-169)/(47-90) 167/66 mmHg (11/21 1600) SpO2:  [89 %-99 %] 95 % (11/21 1600)  Physical Exam  Constitutional: He is oriented to person, place, and time. He appears well-developed and well-nourished. No distress.  HENT:    Mouth/Throat: Oropharynx is clear and moist. No oropharyngeal exudate.  Cardiovascular: Normal rate, regular rhythm and normal heart sounds. Exam reveals no gallop and no friction rub.  No murmur heard.  Pulmonary/Chest: Effort normal and breath sounds normal. No respiratory distress. He has no wheezes.  Abdominal: Soft. Bowel sounds are normal. He exhibits no distension. There is no tenderness.  Lymphadenopathy: no cervical adenopathy.  Neurological: He is alert and oriented to person, place, and time.  Skin: left leg erythema is slightly less, no further expansion outside of margin on his knee but slight expansion to medial aspect of thigh. Blanching. +1-2 pitting edema up to knee Psychiatric: He has a normal mood and affect. His behavior is normal.    Lab Results  [facility] 05/26/12 0430 05/25/12 0510  WBC 19.9* 23.6*  HGB 12.3* 11.8*  HCT 36.2* 35.2*  NA 145 141  K 3.9 3.6  CL 105 104  CO2 29 25  BUN 30* 31*  CREATININE 1.25 1.23  GLU -- --   Liver Panel  Basename 05/26/12 0430  PROT 7.2  ALBUMIN 2.3*  AST 63*  ALT 78*  ALKPHOS 82  BILITOT 0.4  BILIDIR 0.1  IBILI 0.3   Microbiology: 11/17 blood cx NGTD  11/17 urine cx: 35,000 enterobacter (R  cefazolin S ctx S bactrim) 11/18 urine cx: PENDING   Studies/Results: Dg Chest Port 1 View  05/26/2012  *RADIOLOGY REPORT*  Clinical Data: Respiratory failure  PORTABLE CHEST - 1 VIEW  Comparison: 05/24/2012  Findings: Bibasilar patchy opacities are worse.  Low volumes. Borderline cardiomegaly.  No pneumothorax.  IMPRESSION: Bibasilar atelectasis verses airspace disease is worse.   Original Report Authenticated By: Jolaine Click, M.D.    Assessment/Plan: 1) cellulitis = likely due to strep or staph. Since WBC continues to improve, continue with cefazolin and discontinue vanco since no cx positive for MRSA . Will stop clindamycin tomorrow. Can switch to keflex as an oral regimen. Once ready for discharge, can switch to keflex  500mg  QID for a total of 10 days.  (including what he has received in the hospital)  2) bacturia= initial ur cx only has  35,000 enterobacter. Since the patient is not symptomatic and has less than 10 to the 5th colonies... No need to treat. i will discontinue levofloxacin.  Will sign off. Please call if questions.  Drue Second [facility] Infectious Diseases Cell: [phone] Pager: [phone]  05/26/2012, 4:43 PM

## 2012-05-26 NOTE — Progress Notes (Signed)
ANTIBIOTIC CONSULT NOTE - FOLLOW UP  Pharmacy Consult for Vancomycin Indication: Cellulitis, Possible PNA  Allergies  Allergen Reactions  . Alka-Seltzer Heartburn (Sodium Bicarbonate-Citric Acid)   . Citric Acid     All the ingredients in alka seltzer gold  . Potassium Bicarbonate     All the ingredients in alka seltzer gold  . Sodium Bicarbonate     All the ingredients in alka seltzer gold    Patient Measurements: Height: 5\' 9"  (175.3 cm) Weight: 328 lb 14.8 oz (149.2 kg) IBW/kg (Calculated) : 70.7   Vital Signs: Temp: 97.7 F (36.5 C) (11/21 0800) Temp src: Oral (11/21 0400) BP: 151/67 mmHg (11/21 0800) Pulse Rate: 80  (11/21 0600) Intake/Output from previous day: 11/20 0701 - 11/21 0700 In: 1851 [P.O.:1200; I.V.:70; IV Piggyback:581] Out: 2600 [Urine:2600] Intake/Output from this shift:    Labs:  Basename 05/26/12 0430 05/25/12 0510 05/24/12 0530  WBC 19.9* 23.6* 25.9*  HGB 12.3* 11.8* 11.3*  PLT 313 282 257  LABCREA -- -- --  CREATININE 1.25 1.23 1.62*   Estimated Creatinine Clearance: 69.2 ml/min (by C-G formula based on Cr of 1.25). No results found for this basename: VANCOTROUGH:2,VANCOPEAK:2,VANCORANDOM:2,GENTTROUGH:2,GENTPEAK:2,GENTRANDOM:2,TOBRATROUGH:2,TOBRAPEAK:2,TOBRARND:2,AMIKACINPEAK:2,AMIKACINTROU:2,AMIKACIN:2, in the last 72 hours   Microbiology: Recent Results (from the past 720 hour(s))  CULTURE, BLOOD (ROUTINE X 2)     Status: Normal (Preliminary result)   Collection Time   05/22/12  8:33 PM      Component Value Range Status Comment   Specimen Description BLOOD RIGHT HAND   Final    Special Requests BOTTLES DRAWN AEROBIC AND ANAEROBIC 3CC   Final    Culture  Setup Time 05/23/2012 08:55   Final    Culture     Final    Value:        BLOOD CULTURE RECEIVED NO GROWTH TO DATE CULTURE WILL BE HELD FOR 5 DAYS BEFORE ISSUING A FINAL NEGATIVE REPORT   Report Status PENDING   Incomplete   CULTURE, BLOOD (ROUTINE X 2)     Status: Normal (Preliminary  result)   Collection Time   05/22/12  8:44 PM      Component Value Range Status Comment   Specimen Description BLOOD RIGHT HAND   Final    Special Requests BOTTLES DRAWN AEROBIC AND ANAEROBIC 5CC   Final    Culture  Setup Time 05/23/2012 08:54   Final    Culture     Final    Value:        BLOOD CULTURE RECEIVED NO GROWTH TO DATE CULTURE WILL BE HELD FOR 5 DAYS BEFORE ISSUING A FINAL NEGATIVE REPORT   Report Status PENDING   Incomplete   URINE CULTURE     Status: Normal   Collection Time   05/22/12  9:05 PM      Component Value Range Status Comment   Specimen Description URINE, CATHETERIZED   Final    Special Requests NONE   Final    Culture  Setup Time 05/23/2012 09:51   Final    Colony Count 35,000 COLONIES/ML   Final    Culture ENTEROBACTER CLOACAE   Final    Report Status 05/25/2012 FINAL   Final    Organism ID, Bacteria ENTEROBACTER CLOACAE   Final   MRSA PCR SCREENING     Status: Normal   Collection Time   05/23/12  2:31 AM      Component Value Range Status Comment   MRSA by PCR NEGATIVE  NEGATIVE Final   URINE CULTURE  Status: Normal   Collection Time   05/23/12  5:11 PM      Component Value Range Status Comment   Specimen Description URINE, CATHETERIZED   Final    Special Requests NONE   Final    Culture  Setup Time 05/24/2012 02:44   Final    Colony Count NO GROWTH   Final    Culture NO GROWTH   Final    Report Status 05/24/2012 FINAL   Final     Anti-infectives     Start     Dose/Rate Route Frequency Ordered Stop   05/25/12 1400   ceFAZolin (ANCEF) IVPB 2 g/50 mL premix        2 g 100 mL/hr over 30 Minutes Intravenous 3 times per day 05/25/12 0948     05/24/12 1200   clindamycin (CLEOCIN) IVPB 900 mg        900 mg 100 mL/hr over 30 Minutes Intravenous 3 times per day 05/24/12 1122     05/24/12 0800   vancomycin (VANCOCIN) 1,500 mg in sodium chloride 0.9 % 500 mL IVPB        1,500 mg 250 mL/hr over 120 Minutes Intravenous Every 24 hours 05/23/12 0921      05/23/12 0800   piperacillin-tazobactam (ZOSYN) IVPB 3.375 g  Status:  Discontinued        3.375 g 12.5 mL/hr over 240 Minutes Intravenous Every 8 hours 05/23/12 0251 05/25/12 0948   05/23/12 0800   vancomycin (VANCOCIN) 1,250 mg in sodium chloride 0.9 % 250 mL IVPB  Status:  Discontinued        1,250 mg 166.7 mL/hr over 90 Minutes Intravenous Every 24 hours 05/23/12 0257 05/23/12 0921   05/22/12 2130  piperacillin-tazobactam (ZOSYN) IVPB 3.375 g       3.375 g 12.5 mL/hr over 240 Minutes Intravenous  Once 05/22/12 2119 05/23/12 0309   05/22/12 2115   vancomycin (VANCOCIN) IVPB 1000 mg/200 mL premix        1,000 mg 200 mL/hr over 60 Minutes Intravenous  Once 05/22/12 2111 05/22/12 2255          Assessment:  78 y/o M with weakness and SOB transferred from [facility]. Found to have LLE cellulitis, UTI, possible PNA. Started on vanco/zosyn initially. ID is following and changed zosyn to cefazolin as likely cause of patients problems is the cellulitis which ID contributes to staph/strep. Also added clindamycin for 2-3 days for toxin production. WBC 19.9<23.6, Afebrile, Scr 1.25(CrCl~70). Had planned for vanco trough this AM but vancomycin was hanging before the lab was drawn.   11/17 Vanco >> 11/17 Zosyn >>11/20 11/19 Clinda >> 2-3 days for toxins 11/20 Cefazolin>>  11/18 Urine Cx >> NG 11/17 Urine Cx >> 35,000, enterobacter, per ID>>no tx 11/17 Blood x 2>> NGTD  Goal of Therapy:  Vancomycin trough level 15-20 mcg/ml  Plan:  - Continue Vancomycin 1500mg  IV q24h - Continue cefazolin/clindamycin per MD - Vancomycin trough 11/22 - Trend WBC/temp/renal function - Follow clinical progression  Abran Duke, PharmD Clinical Pharmacist Phone: [phone] Pager: [phone] 05/26/2012 10:27 AM

## 2012-05-26 NOTE — Clinical Social Work Psychosocial (Signed)
     Clinical Social Work Department BRIEF PSYCHOSOCIAL ASSESSMENT 05/26/2012  Patient:  Luis Carrillo, Luis Carrillo     Account Number:  [id number]     Admit date:  05/22/2012  Clinical Social Worker:  Hulan Fray  Date/Time:  05/26/2012 11:29 AM  Referred by:  RN  Date Referred:  05/25/2012 Referred for  Other - See comment   Other Referral:   ALF placement   Interview type:  Family Other interview type:   Daughter, Luis Carrillo and patient    PSYCHOSOCIAL DATA Living Status:  WIFE Admitted from facility:   Level of care:   Primary support name:  Lyla Koning Primary support relationship to patient:  SPOUSE Degree of support available:   supportive    CURRENT CONCERNS Current Concerns  Post-Acute Placement   Other Concerns:    SOCIAL WORK ASSESSMENT / PLAN Clinical Social Worker received referral for assisted living placement. CSW introduced self and explained reason for visit. Patient's daughter, Luis Carrillo [phone]) was at bedside. Patient was about to get a bath, so CSW spoke with daughter. Per daughter, she had discussed the need for SNF placement with patient. Per daughter, patient's wife was previously at [facility] and had a good experience. [facility] is closer in location to patient's wife and wife's daughter will be transporting her to see patient, if [facility] extends a bed offer.  Per daughter, patient was agreeable to going to [facility] if available, but understands that they may not have availability. Per daughter, the next areas to initiate SNF search would be in the [place] area. Daughter gave CSW permission to inititate SNF search to [facility] and the [place] area. CSW will complete FL2 for MD's signature.   Assessment/plan status:  Psychosocial Support/Ongoing Assessment of Needs Other assessment/ plan:   Information/referral to community resources:   SNF packet    PATIENTS/FAMILYS RESPONSE TO PLAN OF CARE: Patient and family are agreeable to CSW  initiating SNF search to [facility] and [place] area. Daughter was appreciative of CSW's visit and assistance.

## 2012-05-26 NOTE — Clinical Social Work Placement (Signed)
     Clinical Social Work Department CLINICAL SOCIAL WORK PLACEMENT NOTE 05/26/2012  Patient:  Luis Carrillo, Luis Carrillo  Account Number:  [id number] Admit date:  05/22/2012  Clinical Social Worker:  Hulan Fray  Date/time:  05/26/2012 03:36 PM  Clinical Social Work is seeking post-discharge placement for this patient at the following level of care:   SKILLED NURSING   (*CSW will update this form in Epic as items are completed)   05/26/2012  Patient/family provided with [facility] Department of Clinical Social Works list of facilities offering this level of care within the geographic area requested by the patient (or if unable, by the patients family).  05/26/2012  Patient/family informed of their freedom to choose among providers that offer the needed level of care, that participate in Medicare, Medicaid or managed care program needed by the patient, have an available bed and are willing to accept the patient.  05/26/2012  Patient/family informed of [facility] ownership interest in [facility], as well as of the fact that they are under no obligation to receive care at this facility.  PASARR submitted to EDS on 05/26/2012 PASARR number received from EDS on 05/26/2012  FL2 transmitted to all facilities in geographic area requested by pt/family on  05/26/2012 FL2 transmitted to all facilities within larger geographic area on   Patient informed that his/her managed care company has contracts with or will negotiate with  certain facilities, including the following:     Patient/family informed of bed offers received:   Patient chooses bed at  Physician recommends and patient chooses bed at    Patient to be transferred to  on   Patient to be transferred to facility by   The following physician request were entered in Epic:   Additional Comments:

## 2012-05-26 NOTE — Progress Notes (Signed)
RT note: Pt states his breathing is ok right now and does not want to go on bipap tonight. At this time he is in no distress SPO2 98% on 2l [place].RR 20, HR 84.

## 2012-05-26 NOTE — Clinical Social Work Note (Signed)
Clinical Social Worker received referral for ALF placement. CSW will meet with daughter, Momodou Consiglio at 3pm today. Full assessment to follow.   Rozetta Nunnery MSW, [employer] [phone]

## 2012-05-27 DIAGNOSIS — N179 Acute kidney failure, unspecified: Secondary | ICD-10-CM | POA: Diagnosis present

## 2012-05-27 DIAGNOSIS — I878 Other specified disorders of veins: Secondary | ICD-10-CM | POA: Diagnosis present

## 2012-05-27 DIAGNOSIS — R238 Other skin changes: Secondary | ICD-10-CM | POA: Diagnosis not present

## 2012-05-27 DIAGNOSIS — L03119 Cellulitis of unspecified part of limb: Secondary | ICD-10-CM | POA: Diagnosis present

## 2012-05-27 DIAGNOSIS — A419 Sepsis, unspecified organism: Secondary | ICD-10-CM | POA: Diagnosis present

## 2012-05-27 DIAGNOSIS — I440 Atrioventricular block, first degree: Secondary | ICD-10-CM

## 2012-05-27 DIAGNOSIS — I119 Hypertensive heart disease without heart failure: Secondary | ICD-10-CM | POA: Diagnosis present

## 2012-05-27 DIAGNOSIS — J189 Pneumonia, unspecified organism: Secondary | ICD-10-CM | POA: Diagnosis present

## 2012-05-27 DIAGNOSIS — E1165 Type 2 diabetes mellitus with hyperglycemia: Secondary | ICD-10-CM | POA: Diagnosis present

## 2012-05-27 DIAGNOSIS — D72829 Elevated white blood cell count, unspecified: Secondary | ICD-10-CM | POA: Diagnosis present

## 2012-05-27 DIAGNOSIS — G8929 Other chronic pain: Secondary | ICD-10-CM | POA: Diagnosis present

## 2012-05-27 DIAGNOSIS — I442 Atrioventricular block, complete: Secondary | ICD-10-CM | POA: Diagnosis present

## 2012-05-27 LAB — BASIC METABOLIC PANEL WITH GFR
BUN: 31 mg/dL — ABNORMAL HIGH (ref 6–23)
CO2: 29 meq/L (ref 19–32)
Calcium: 8.8 mg/dL (ref 8.4–10.5)
Chloride: 101 meq/L (ref 96–112)
Creatinine, Ser: 1.12 mg/dL (ref 0.50–1.35)
GFR calc Af Amer: 70 mL/min — ABNORMAL LOW
GFR calc non Af Amer: 61 mL/min — ABNORMAL LOW
Glucose, Bld: 156 mg/dL — ABNORMAL HIGH (ref 70–99)
Potassium: 3.5 meq/L (ref 3.5–5.1)
Sodium: 139 meq/L (ref 135–145)

## 2012-05-27 LAB — CBC
HCT: 34.5 % — ABNORMAL LOW (ref 39.0–52.0)
Hemoglobin: 11.6 g/dL — ABNORMAL LOW (ref 13.0–17.0)
MCH: 31 pg (ref 26.0–34.0)
MCHC: 33.6 g/dL (ref 30.0–36.0)
MCV: 92.2 fL (ref 78.0–100.0)
Platelets: 317 10*3/uL (ref 150–400)
RBC: 3.74 MIL/uL — ABNORMAL LOW (ref 4.22–5.81)
RDW: 14 % (ref 11.5–15.5)
WBC: 18.2 10*3/uL — ABNORMAL HIGH (ref 4.0–10.5)

## 2012-05-27 MED ORDER — MORPHINE SULFATE 2 MG/ML IJ SOLN: 1.0000 mg | INTRAMUSCULAR | Status: DC | PRN

## 2012-05-27 MED ORDER — CEFAZOLIN SODIUM-DEXTROSE 2-3 GM-% IV SOLR
2.0000 g | Freq: Three times a day (TID) | INTRAVENOUS | Status: DC
  Administered 2012-05-27 – 2012-05-31 (×12): 2 g via INTRAVENOUS
  Filled 2012-05-27 (×16): qty 50

## 2012-05-27 MED ORDER — INSULIN ASPART 100 UNIT/ML ~~LOC~~ SOLN
0.0000 [IU] | Freq: Three times a day (TID) | SUBCUTANEOUS | Status: DC
  Administered 2012-05-27: 3 [IU] via SUBCUTANEOUS
  Administered 2012-05-27: 4 [IU] via SUBCUTANEOUS
  Administered 2012-05-28: 3 [IU] via SUBCUTANEOUS
  Administered 2012-05-28 – 2012-05-30 (×8): 4 [IU] via SUBCUTANEOUS

## 2012-05-27 MED ORDER — INSULIN ASPART 100 UNIT/ML ~~LOC~~ SOLN: 0.0000 [IU] | Freq: Every day | SUBCUTANEOUS | Status: DC

## 2012-05-27 MED ORDER — CEFTRIAXONE SODIUM 1 G IJ SOLR
1.0000 g | INTRAMUSCULAR | Status: DC
  Administered 2012-05-27: 1 g via INTRAVENOUS
  Filled 2012-05-27: qty 10

## 2012-05-27 MED ORDER — OXYCODONE-ACETAMINOPHEN 5-325 MG PO TABS
1.0000 | ORAL_TABLET | ORAL | Status: DC | PRN
  Administered 2012-05-27 (×2): 2 via ORAL
  Administered 2012-05-28 – 2012-05-31 (×9): 1 via ORAL
  Administered 2012-05-31: 2 via ORAL
  Filled 2012-05-27: qty 2
  Filled 2012-05-27 (×2): qty 1
  Filled 2012-05-27: qty 2
  Filled 2012-05-27 (×2): qty 1
  Filled 2012-05-27: qty 2
  Filled 2012-05-27: qty 1
  Filled 2012-05-27: qty 2
  Filled 2012-05-27 (×3): qty 1

## 2012-05-27 MED ORDER — DEXTROSE 5 % IV SOLN
500.0000 mg | INTRAVENOUS | Status: DC
  Administered 2012-05-27: 500 mg via INTRAVENOUS
  Filled 2012-05-27: qty 500

## 2012-05-27 NOTE — Progress Notes (Signed)
Pt lower left extremity weeping. Abd and kerlex soaked. Abd pad replaced and Kerlex re-wrapped and ace bandage applied per WOC order. Jobe Igo A RN, 05/27/2012 8:59 PM

## 2012-05-27 NOTE — Progress Notes (Signed)
[facility]  Date of Admission:  05/22/2012  Antibiotics: Azith/ceftriaxone 11/22 -  3 days clindamycin Cefazolin 11/19 -22   Subjective: Worried about change of antibiotics  Objective: Temp:  [97.7 F (36.5 C)-98.3 F (36.8 C)] 97.8 F (36.6 C) (11/22 0848) Pulse Rate:  [68-96] 80  (11/22 0848) Resp:  [22] 22  (11/22 0848) BP: (139-167)/(58-75) 166/66 mmHg (11/22 0848) SpO2:  [89 %-98 %] 97 % (11/22 0848) Weight:  [317 lb 3.9 oz (143.9 kg)] 317 lb 3.9 oz (143.9 kg) (11/22 0437)  General: Awake, alert, nad Skin: erythema and some warmth below tibial tuberosity Lungs: CTA B Cor: RRR without m/r/g Abdomen: morbidly obese   Lab Results Lab Results  Component Value Date   WBC 18.2* 05/27/2012   HGB 11.6* 05/27/2012   HCT 34.5* 05/27/2012   MCV 92.2 05/27/2012   PLT 317 05/27/2012    Lab Results  Component Value Date   CREATININE 1.12 05/27/2012   BUN 31* 05/27/2012   NA 139 05/27/2012   K 3.5 05/27/2012   CL 101 05/27/2012   CO2 29 05/27/2012    Lab Results  Component Value Date   ALT 78* 05/26/2012   AST 63* 05/26/2012   ALKPHOS 82 05/26/2012   BILITOT 0.4 05/26/2012      Microbiology: Recent Results (from the past 240 hour(s))  CULTURE, BLOOD (ROUTINE X 2)     Status: Normal (Preliminary result)   Collection Time   05/22/12  8:33 PM      Component Value Range Status Comment   Specimen Description BLOOD RIGHT HAND   Final    Special Requests BOTTLES DRAWN AEROBIC AND ANAEROBIC 3CC   Final    Culture  Setup Time 05/23/2012 08:55   Final    Culture     Final    Value:        BLOOD CULTURE RECEIVED NO GROWTH TO DATE CULTURE WILL BE HELD FOR 5 DAYS BEFORE ISSUING A FINAL NEGATIVE REPORT   Report Status PENDING   Incomplete   CULTURE, BLOOD (ROUTINE X 2)     Status: Normal (Preliminary result)   Collection Time   05/22/12  8:44 PM      Component Value Range Status Comment   Specimen Description BLOOD RIGHT HAND   Final      Special Requests BOTTLES DRAWN AEROBIC AND ANAEROBIC 5CC   Final    Culture  Setup Time 05/23/2012 08:54   Final    Culture     Final    Value:        BLOOD CULTURE RECEIVED NO GROWTH TO DATE CULTURE WILL BE HELD FOR 5 DAYS BEFORE ISSUING A FINAL NEGATIVE REPORT   Report Status PENDING   Incomplete   URINE CULTURE     Status: Normal   Collection Time   05/22/12  9:05 PM      Component Value Range Status Comment   Specimen Description URINE, CATHETERIZED   Final    Special Requests NONE   Final    Culture  Setup Time 05/23/2012 09:51   Final    Colony Count 35,000 COLONIES/ML   Final    Culture ENTEROBACTER CLOACAE   Final    Report Status 05/25/2012 FINAL   Final    Organism ID, Bacteria ENTEROBACTER CLOACAE   Final   MRSA PCR SCREENING     Status: Normal   Collection Time   05/23/12  2:31 AM  Component Value Range Status Comment   MRSA by PCR NEGATIVE  NEGATIVE Final   URINE CULTURE     Status: Normal   Collection Time   05/23/12  5:11 PM      Component Value Range Status Comment   Specimen Description URINE, CATHETERIZED   Final    Special Requests NONE   Final    Culture  Setup Time 05/24/2012 02:44   Final    Colony Count NO GROWTH   Final    Culture NO GROWTH   Final    Report Status 05/24/2012 FINAL   Final     Studies/Results: Dg Chest Port 1 View  05/26/2012  *RADIOLOGY REPORT*  Clinical Data: Respiratory failure  PORTABLE CHEST - 1 VIEW  Comparison: 05/24/2012  Findings: Bibasilar patchy opacities are worse.  Low volumes. Borderline cardiomegaly.  No pneumothorax.  IMPRESSION: Bibasilar atelectasis verses airspace disease is worse.   Original Report Authenticated By: Jolaine Click, M.D.     Assessment/Plan: 1)  Cellulitis - ceftriaxone and azithromycin not opitmal treatment for cellulitis.  I will change back to cefazolin.  No need for further clindamycin.  Continue with keflex 500 mg qid at discharge for 10 days total of antibiotics (day 4 now).    2)  ceftiraxone/azithromycin - not sure of indication, I do not see any indication of pneumonia with no significant SOB, no increased cough, WBC improving.  CXR findings noted, likely worsening atelectasis.  Patient needs to ambulate, consider insentive spirometry.  I will d/c.    Call with questions, thanks.    Staci Righter, MD [facility] [phone] pager   05/27/2012, 11:18 AM

## 2012-05-27 NOTE — Consult Note (Signed)
ANTIBIOTIC CONSULT NOTE - INITIAL  Pharmacy Consult for Ancef Indication: cellulitis  Allergies  Allergen Reactions  . Alka-Seltzer Heartburn (Sodium Bicarbonate-Citric Acid)   . Citric Acid     All the ingredients in alka seltzer gold  . Potassium Bicarbonate     All the ingredients in alka seltzer gold  . Sodium Bicarbonate     All the ingredients in alka seltzer gold    Patient Measurements: Height: 5\' 9"  (175.3 cm) Weight: 317 lb 3.9 oz (143.9 kg) IBW/kg (Calculated) : 70.7   Vital Signs: Temp: 97.8 F (36.6 C) (11/22 0848) Temp src: Oral (11/22 0848) BP: 166/66 mmHg (11/22 0848) Pulse Rate: 80  (11/22 0848) Intake/Output from previous day: 11/21 0701 - 11/22 0700 In: 1300 [P.O.:470; I.V.:30; IV Piggyback:800] Out: 1250 [Urine:1250] Intake/Output from this shift: Total I/O In: 240 [P.O.:240] Out: -   Labs:  Basename 05/27/12 0500 05/26/12 0430 05/25/12 0510  WBC 18.2* 19.9* 23.6*  HGB 11.6* 12.3* 11.8*  PLT 317 313 282  LABCREA -- -- --  CREATININE 1.12 1.25 1.23   Estimated Creatinine Clearance: 75.6 ml/min (by C-G formula based on Cr of 1.12).  Assessment: 81yom continues on day #6 total antibiotics for r/o pna and LLE cellulitis. Antibiotics changed to CTX and azithromycin yesterday but no clear indication for this change per ID so changing back to ancef. ARF has resolved.   11/17 Vanco >>11/21 11/17 Zosyn >>11/20 11/19 Clinda >> 11/21 11/20 Cefazolin>>11/21>>11/22>> 11/22 CTX x1 11/22 Azith x 1  11/18 Urine Cx >> negative final 11/17 Urine Cx >> 35,000, enterobacter, per ID>>no tx 11/17 Blood x 2>> NGTD  Goal of Therapy:  Appropriate ancef dosing  Plan:  1) Ancef 2g IV q8 2) Continue to follow cultures, renal function 3) Follow up switch to keflex at discharge for 10 days antibiotics total  Fredrik Rigger 05/27/2012,11:36 AM

## 2012-05-27 NOTE — Progress Notes (Signed)
Pt. Arrived to 4728 in recliner.  Pt. Settled and oriented in room.  Pt. Denying any pain.  VSS.

## 2012-05-27 NOTE — Consult Note (Signed)
Wound re-consult requested.  Initial consult performed for cellulitis and small blistered area on 11/19. Refer to previous progress notes for assessment and plan of care.  Cellulitis has evolved into blisters to left lower calf which have ruptured and are now partial thickness skin loss.  Upper thigh remains with generalized edema and erythremia, no open wounds or weeping.  Left anterior calf remains very swollen and tight and bright red.  Entire anterior calf with weeping peeling blisters, approx 20X20X.1cm.  Large amt yellow drainage, no odor, exposed wound bed pink, tender, and moist.  Plan:  Foam dressings to absorb drainage and protect skin.  Abd pads and kerlex to assist with absorbing moisture.  Ace wrap for light compression.  Family at bedside, discussed plan of care with pt and family.  Expect skin may continue to evolve into further areas of blistering and peeling.   Will not plan to follow further unless re-consulted.  [address] RN, MSN, [employer]  [phone]

## 2012-05-27 NOTE — Progress Notes (Signed)
[facility] Progress Note [place] TEAM 1 - Stepdown/ICU [place] Dollinger ZOX:[id number] DOB: Feb 26, 1931 DOA: 05/22/2012 PCP: No primary provider on file.  Brief narrative: 78 years old morbid obese male with PMH relevant for DM, HTN, dyslipidemia and chronic pain on narcotics. Presented with two weeks of worsening SOB, productive cough and more recently fever and chills. Over the last couple of days he developed LLE erythema and swelling. Does not recall trauma but there is a healing wound in the LLE. At the time of evaluation by the admitting physician the patient will awake, alert, oriented x3 and hemodynamically stable. At admission found to be briefly in complete heart block but at that time the admitting physician had arrived to assist the patient he had converted to a 2:1 AV block. Cardiology was consulted. UA was consistent with UTI. Denied N/V/D, urinary symptoms. No CP. He was subsequently admitted to the ICU by critical care medicine. He was felt to be septic related to his lower extremity cellulitis. Because of his complaints of coughing it was thought he initially had pneumonia he was started on broad-spectrum antibiotics. Cardiology evaluated the patient and AV nodal blocking agents were held and patient currently is maintaining sinus rhythm. No indications for pacemaker at this time. Infectious disease was consulted regarding the lower extremity cellulitis. Antibiotics have been narrowed to Ancef. Clindamycin was discontinued on 05/26/2012. He was subsequently transferred to the telemetry unit and team 1 assumed care of this patient on 05/27/2012.   Assessment/Plan: Principal Problem:  *Sepsis *Resolved and likely source from lower extremity cellulitis  Active Problems:  Leukocytosis/Lower extremity cellulitis *Antibiotics have been changed to Rocephin and Zithromax earlier today because of concerns of continued respiratory symptoms related to a community-acquired pneumonia  but infectious disease feels the patient does not have an pneumonia and has subsequently changed the patient back to Ancef and recommends 500 of Keflex 4 times a day at discharge for a total of 10 days of antibiotics-today is day #4 *Will increase when necessary Percocet to 1-2 tabs every 4 hours for moderate pain and add IV morphine every 2 hours when necessary for severe pain   Lower extremity venous stasis (left)/ Formation of vesicles left leg *Doppler studies this admission were negative for DVT *Because of evolution of lower extremity wounds with significant vesicular formation in the school may she of superficial skin we asked wound care to evaluate the patient and recommendations are to continue with current wound care   Acute respiratory failure with hypoxia/?? CAP (community acquired pneumonia) *At presentation patient was primarily complaining of shortness of breath and a productive cough which he describes as being dark in appearance but he says this is normal for him *Echocardiogram was completed this admission and this demonstrated preserved LV function, no evidence of right heart failure diastolic dysfunction *He continues to require nasal cannula oxygen but we will try to wean this *Patient currently is positive for 445 cc and he could have a degree of volume overload so can consider giving a one-time dose of Lasix and monitor renal function since also had concomitant acute renal failure at presentation.   Brief CHB (complete heart block)/ 1St degree AV block *Appreciate cardiology assistance *Pre-admission beta blocker remains on hold *Currently maintaining sinus rhythm *No indications for pacemaker at this time   Acute renal failure/ATN *Presenting BUN and creatinine were 40 and 1.9 and within 24 hours had increased to 36 and 2.34 but this was in the setting of acute sepsis  and utilization of empiric Zosyn and vancomycin *Patient was also on diuretics, ACE inhibitor, NSAID and  metformin prior to admission likely predisposing to ATN *Current creatinine has decreased to 1.12 but BUN remains elevated at 31 *At this point would be reluctant to resume ACE inhibitor and diuretic for hypertensive control and because of recent issues with AV nodal block would be reluctant to resume beta blockers so we'll need to consider alternative agent such as Norvasc or hydralazine *Continue to follow electrolytes   Diabetes mellitus type 2, uncontrolled *Hemoglobin A1c this admission was 6.7 reflects a mean plasma glucose of 146 prior to admission *Currently only on sliding scale insulin *CBGs have peaked recently at 183 so we'll resume home glyburide but we'll continue to hold metformin because of renal function *Creatinine clearance is high enough to continue metformin after discharge   HTN (hypertension) *Blood pressure is now 1 hypertensive range his since patient is off usual diuretic, ACE inhibitor and metoprolol *We'll begin Norvasc 10 mg daily and if blood pressure remains inadequately controlled over the next 48 hours can consider adding hydralazine or if renal function improves consider adding back ACE inhibitor *Last cardiology note from 05/25/2012 recommends continue to hold AV nodal blocking agent   DVT prophylaxis: Subcutaneous heparin 3 times a day Code Status: Full Family Communication: Spoke with patient Disposition Plan: Remain in telemetry  Consultants: Cardiology Infectious disease Wound ostomy care  Critical care medicine-signed all  Procedures: None  Antibiotics: Zosyn and Vancomycin 05/22/12>>>11/21  Clinda  11/19>>>11/21  Ancef 11/21>>>   HPI/Subjective: Patient alert and sitting up in chair. States has more difficulty breathing when seated in bed because of mechanical issues related to large abdomen pushing up at the diaphragm. Still has wet sounding productive cough with dark sputum reported by patient. Complains of significant pain and left  lower extremity.   Objective: Blood pressure 166/66, pulse 80, temperature 97.8 F (36.6 C), temperature source Oral, resp. rate 22, height 5\' 9"  (1.753 m), weight 145.469 kg (320 lb 11.2 oz), SpO2 97.00%.  Intake/Output Summary (Last 24 hours) at 05/27/12 1407 Last data filed at 05/27/12 0853  Gross per 24 hour  Intake    870 ml  Output   1250 ml  Net   -380 ml     Exam: General: No acute respiratory distress Lungs: Coarse but Clear to auscultation bilaterally without wheezes or crackles, nasal cannula oxygen at 3 L per minute Cardiovascular: Regular rate and rhythm without murmur gallop or rub normal S1 and S2, trace peripheral edema right lower extremity, significant pitting edema from the thigh to the toes of left lower extremity, no JVD Abdomen: Nontender, nondistended, soft, bowel sounds positive, no rebound, no ascites, no appreciable mass Musculoskeletal: No significant cyanosis, clubbing of bilateral lower extremities Neurological: Alert and oriented x3, moves all extremities x4 with a left lower strategy been limited by significant edema and pain, exam non focal Skin: Patient has ruby red erythema involving the left lower extremity below the knee and a circumferential sock-like pattern which extends to the foot and irregularly to the toes, he also has multiple vesicular areas ranging from small to very large with superficial desquamation at areas where the vesicles have ruptured, there is one significant ulcerated wound in the anterior tibia, there is no purulent drainage  Data Reviewed: Basic Metabolic Panel:  Lab 05/27/12 8295 05/26/12 0430 05/25/12 0510 05/24/12 0530 05/23/12 0505  NA 139 145 141 138 136  K 3.5 3.9 3.6 3.5 3.6  CL 101  105 104 103 98  CO2 29 29 25 20 20   GLUCOSE 156* 175* 188* 160* 225*  BUN 31* 30* 31* 38* 36*  CREATININE 1.12 1.25 1.23 1.62* 2.34*  CALCIUM 8.8 8.8 8.6 8.1* 8.0*  MG -- 2.0 2.2 1.9 --  PHOS -- 2.5 2.6 3.3 --   Liver Function  Tests:  Lab 05/26/12 0430 05/23/12 0910  AST 63* 94*  ALT 78* 68*  ALKPHOS 82 69  BILITOT 0.4 0.4  PROT 7.2 6.6  ALBUMIN 2.3* 2.5*   No results found for this basename: LIPASE:5,AMYLASE:5 in the last 168 hours No results found for this basename: AMMONIA:5 in the last 168 hours CBC:  Lab 05/27/12 0500 05/26/12 0430 05/25/12 0510 05/24/12 0530 05/23/12 0505 05/22/12 1933  WBC 18.2* 19.9* 23.6* 25.9* 28.8* --  NEUTROABS -- -- 20.9* -- -- 26.6*  HGB 11.6* 12.3* 11.8* 11.3* 11.2* --  HCT 34.5* 36.2* 35.2* 33.7* 33.1* --  MCV 92.2 91.4 91.2 91.1 90.9 --  PLT 317 313 282 257 295 --   Cardiac Enzymes:  Lab 05/22/12 1933  CKTOTAL --  CKMB --  CKMBINDEX --  TROPONINI <0.30   BNP (last 3 results) No results found for this basename: PROBNP:3 in the last 8760 hours CBG:  Lab 05/27/12 1052 05/27/12 0815 05/26/12 2202 05/26/12 1631 05/26/12 1120  GLUCAP 163* 142* 119* 183* 138*    Recent Results (from the past 240 hour(s))  CULTURE, BLOOD (ROUTINE X 2)     Status: Normal (Preliminary result)   Collection Time   05/22/12  8:33 PM      Component Value Range Status Comment   Specimen Description BLOOD RIGHT HAND   Final    Special Requests BOTTLES DRAWN AEROBIC AND ANAEROBIC 3CC   Final    Culture  Setup Time 05/23/2012 08:55   Final    Culture     Final    Value:        BLOOD CULTURE RECEIVED NO GROWTH TO DATE CULTURE WILL BE HELD FOR 5 DAYS BEFORE ISSUING A FINAL NEGATIVE REPORT   Report Status PENDING   Incomplete   CULTURE, BLOOD (ROUTINE X 2)     Status: Normal (Preliminary result)   Collection Time   05/22/12  8:44 PM      Component Value Range Status Comment   Specimen Description BLOOD RIGHT HAND   Final    Special Requests BOTTLES DRAWN AEROBIC AND ANAEROBIC 5CC   Final    Culture  Setup Time 05/23/2012 08:54   Final    Culture     Final    Value:        BLOOD CULTURE RECEIVED NO GROWTH TO DATE CULTURE WILL BE HELD FOR 5 DAYS BEFORE ISSUING A FINAL NEGATIVE REPORT    Report Status PENDING   Incomplete   URINE CULTURE     Status: Normal   Collection Time   05/22/12  9:05 PM      Component Value Range Status Comment   Specimen Description URINE, CATHETERIZED   Final    Special Requests NONE   Final    Culture  Setup Time 05/23/2012 09:51   Final    Colony Count 35,000 COLONIES/ML   Final    Culture ENTEROBACTER CLOACAE   Final    Report Status 05/25/2012 FINAL   Final    Organism ID, Bacteria ENTEROBACTER CLOACAE   Final   MRSA PCR SCREENING     Status: Normal   Collection Time  05/23/12  2:31 AM      Component Value Range Status Comment   MRSA by PCR NEGATIVE  NEGATIVE Final   URINE CULTURE     Status: Normal   Collection Time   05/23/12  5:11 PM      Component Value Range Status Comment   Specimen Description URINE, CATHETERIZED   Final    Special Requests NONE   Final    Culture  Setup Time 05/24/2012 02:44   Final    Colony Count NO GROWTH   Final    Culture NO GROWTH   Final    Report Status 05/24/2012 FINAL   Final      Studies:  Recent x-ray studies have been reviewed in detail by the Attending Physician  Scheduled Meds:  Reviewed in detail by the Attending Physician   Junious Silk, ANP [facility] Office  [phone] Pager [phone]  On-Call/Text Page:      [email or web address]      password [email or web address]  If 7PM-7AM, please contact night-coverage [email or web address] Password [email or web address] 05/27/2012, 2:07 PM   LOS: 5 days   I have examined the patient and reviewed the chart. I agree with the above note.   Calvert Cantor, MD [phone]

## 2012-05-27 NOTE — Progress Notes (Signed)
Physical Therapy Treatment Patient Details Name: Luis Carrillo MRN: [id number] DOB: 1930-07-13 Today's Date: 05/27/2012 Time: 4098-1191 PT Time Calculation (min): 31 min  PT Assessment / Plan / Recommendation Comments on Treatment Session  Moving much better but still limited by pain and decreased activity tolerance. Agree he needs rehab placement prior to d/c.     Follow Up Recommendations  SNF     Does the patient have the potential to tolerate intense rehabilitation     Barriers to Discharge        Equipment Recommendations  Rolling walker with 5" wheels    Recommendations for Other Services    Frequency Min 3X/week   Plan Discharge plan needs to be updated;Frequency remains appropriate    Precautions / Restrictions Precautions Precautions: Fall Restrictions Weight Bearing Restrictions: No   Pertinent Vitals/Pain C/o 9/10 pain LLE   Mobility  Bed Mobility Bed Mobility: Not assessed Details for Bed Mobility Assistance: pt up in recliner Transfers Transfers: Sit to Stand;Stand to Sit (2 trials) Sit to Stand: 4: Min assist;With upper extremity assist;From chair/3-in-1;With armrests Stand to Sit: To chair/3-in-1;4: Min assist;With upper extremity assist;With armrests Details for Transfer Assistance: cues positioning, improved rise today with less physical assist, not using the rocking motion today, min tactile assist for follow through and controlled descent Ambulation/Gait Ambulation/Gait Assistance: 4: Min guard Ambulation Distance (Feet): 60 Feet (30 ft x2) Assistive device: Rolling walker Ambulation/Gait Assistance Details: cues for tall posture and safe positioning within RW, seated rest in between walks Gait Pattern: Trunk flexed;Shuffle;Wide base of support    Exercises General Exercises - Lower Extremity Ankle Circles/Pumps: AROM;Both;10 reps;Seated Short Arc Quad: AROM;Both;20 reps;Seated Toe Raises: AROM;Both;20 reps;Seated Heel Raises: AROM;Both;20  reps;Seated    PT Goals Acute Rehab PT Goals PT Goal: Sit to Stand - Progress: Progressing toward goal PT Goal: Stand to Sit - Progress: Progressing toward goal PT Transfer Goal: Bed to Chair/Chair to Bed - Progress: Progressing toward goal PT Goal: Stand - Progress: Progressing toward goal PT Goal: Ambulate - Progress: Progressing toward goal PT Goal: Perform Home Exercise Program - Progress: Progressing toward goal  Visit Information  Last PT Received On: 05/27/12 Assistance Needed: +2 (chair follow)    Subjective Data  Subjective: 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 I have beautiful children.    Cognition  Overall Cognitive Status: Appears within functional limits for tasks assessed/performed Arousal/Alertness: Awake/alert Orientation Level: Appears intact for tasks assessed Behavior During Session: [facility] for tasks performed    Balance     End of Session PT - End of Session Equipment Utilized During Treatment: Gait belt Activity Tolerance: Patient tolerated treatment well;Patient limited by pain Patient left: in chair;with call bell/phone within reach   GP     [facility] HELEN 05/27/2012, 11:03 AM

## 2012-05-28 LAB — BASIC METABOLIC PANEL
BUN: 27 mg/dL — ABNORMAL HIGH (ref 6–23)
Calcium: 8.8 mg/dL (ref 8.4–10.5)
Creatinine, Ser: 1.03 mg/dL (ref 0.50–1.35)
GFR calc non Af Amer: 67 mL/min — ABNORMAL LOW (ref >90)
Glucose, Bld: 141 mg/dL — ABNORMAL HIGH (ref 70–99)

## 2012-05-28 LAB — CBC
HCT: 36 % — ABNORMAL LOW (ref 39.0–52.0)
Hemoglobin: 11.8 g/dL — ABNORMAL LOW (ref 13.0–17.0)
MCH: 30.8 pg (ref 26.0–34.0)
MCHC: 32.8 g/dL (ref 30.0–36.0)
MCV: 94 fL (ref 78.0–100.0)
RDW: 14.3 % (ref 11.5–15.5)

## 2012-05-28 LAB — GLUCOSE, CAPILLARY
Glucose-Capillary: 140 mg/dL — ABNORMAL HIGH (ref 70–99)
Glucose-Capillary: 162 mg/dL — ABNORMAL HIGH (ref 70–99)

## 2012-05-28 MED ORDER — POTASSIUM CHLORIDE CRYS ER 20 MEQ PO TBCR
20.0000 meq | EXTENDED_RELEASE_TABLET | Freq: Once | ORAL | Status: AC
  Administered 2012-05-28: 20 meq via ORAL

## 2012-05-28 MED ORDER — HYDROCHLOROTHIAZIDE 25 MG PO TABS
25.0000 mg | ORAL_TABLET | Freq: Every day | ORAL | Status: DC
  Administered 2012-05-28 – 2012-05-30 (×3): 25 mg via ORAL
  Filled 2012-05-28 (×4): qty 1

## 2012-05-28 NOTE — Progress Notes (Signed)
Left leg weeping and has soaked through the bandage. Dressing and gauze changed and wrapped with kerlex and ace bandage. Patient states feels ok on leg. Will continue to monitor to end of shift.

## 2012-05-29 LAB — GLUCOSE, CAPILLARY
Glucose-Capillary: 180 mg/dL — ABNORMAL HIGH (ref 70–99)
Glucose-Capillary: 156 mg/dL — ABNORMAL HIGH (ref 70–99)

## 2012-05-29 LAB — BASIC METABOLIC PANEL
CO2: 25 mEq/L (ref 19–32)
Calcium: 8.9 mg/dL (ref 8.4–10.5)
Chloride: 100 mEq/L (ref 96–112)
Creatinine, Ser: 0.97 mg/dL (ref 0.50–1.35)
GFR calc Af Amer: 89 mL/min — ABNORMAL LOW (ref >90)
Sodium: 139 mEq/L (ref 135–145)

## 2012-05-29 LAB — CULTURE, BLOOD (ROUTINE X 2)
Culture: NO GROWTH
Culture: NO GROWTH

## 2012-05-29 LAB — CBC
Platelets: 325 10*3/uL (ref 150–400)
RBC: 3.91 MIL/uL — ABNORMAL LOW (ref 4.22–5.81)
RDW: 14.1 % (ref 11.5–15.5)
WBC: 19.4 10*3/uL — ABNORMAL HIGH (ref 4.0–10.5)

## 2012-05-29 MED ORDER — FUROSEMIDE 20 MG PO TABS
20.0000 mg | ORAL_TABLET | Freq: Every day | ORAL | Status: AC
  Administered 2012-05-29 – 2012-05-30 (×2): 20 mg via ORAL
  Filled 2012-05-29 (×2): qty 1

## 2012-05-29 MED ORDER — FUROSEMIDE 20 MG PO TABS
20.0000 mg | ORAL_TABLET | Freq: Every day | ORAL | Status: DC
  Filled 2012-05-29: qty 1

## 2012-05-29 MED ORDER — POTASSIUM CHLORIDE CRYS ER 20 MEQ PO TBCR: 20.0000 meq | EXTENDED_RELEASE_TABLET | Freq: Every day | ORAL | Status: DC

## 2012-05-29 MED ORDER — POTASSIUM CHLORIDE CRYS ER 20 MEQ PO TBCR
20.0000 meq | EXTENDED_RELEASE_TABLET | Freq: Two times a day (BID) | ORAL | Status: DC
  Administered 2012-05-29 – 2012-05-31 (×5): 20 meq via ORAL
  Filled 2012-05-29 (×6): qty 1

## 2012-05-29 MED ORDER — AMLODIPINE BESYLATE 10 MG PO TABS
10.0000 mg | ORAL_TABLET | Freq: Every day | ORAL | Status: DC
  Administered 2012-05-29 – 2012-05-31 (×3): 10 mg via ORAL
  Filled 2012-05-29 (×4): qty 1

## 2012-05-29 NOTE — Progress Notes (Signed)
Patient HR decreased to 39, sustaining. Claiborne Billings, NP notified and EKG obtained.  Current HR 56. RN will continue to monitor. Louretta Parma, RN

## 2012-05-29 NOTE — Progress Notes (Addendum)
[facility] Progress Note    Luis Carrillo YQM:[id number] DOB: [birth year] DOA: 05/22/2012 PCP: No primary provider on file.  Brief narrative: 78 years old morbid obese male with PMH relevant for DM, HTN, dyslipidemia and chronic pain on narcotics. Presented with two weeks of worsening SOB, productive cough and more recently fever and chills. Over the last couple of days he developed LLE erythema and swelling. Does not recall trauma but there is a healing wound in the LLE. At the time of evaluation by the admitting physician the patient will awake, alert, oriented x3 and hemodynamically stable. At admission found to be briefly in complete heart block but at that time the admitting physician had arrived to assist the patient he had converted to a 2:1 AV block. Cardiology was consulted. UA was consistent with UTI. Denied N/V/D, urinary symptoms. No CP. He was subsequently admitted to the ICU by critical care medicine. He was felt to be septic related to his lower extremity cellulitis. Because of his complaints of coughing it was thought he initially had pneumonia he was started on broad-spectrum antibiotics. Cardiology evaluated the patient and AV nodal blocking agents were held and patient currently is maintaining sinus rhythm. No indications for pacemaker at this time. Infectious disease was consulted regarding the lower extremity cellulitis. Antibiotics have been narrowed to Ancef. Clindamycin was discontinued on 05/26/2012. He was subsequently transferred to the telemetry unit and team 1 assumed care of this patient on 05/27/2012. Txed to [facility] 11/23   Assessment/Plan: Principal Problem:  *Sepsis *Resolved and likely source from lower extremity cellulitis  Active Problems:  Leukocytosis/Lower extremity cellulitis *Was initially on Rocephin/zithromax, Infectious disease felt that patient does not have an pneumonia and subsequently started the patient back on Ancef and recommends 500 of Keflex 4  times a day at discharge for a total of 10 days of antibiotics-today is day #6 *Continue Percocet to 1-2 tabs every 4 hours PRN   Lower extremity venous stasis (left)/ Formation of vesicles left leg *Doppler studies this admission were negative for DVT *Because of evolution of lower extremity wounds with significant blister formation then rupture and skin loss, seen by wound care, continue dressing changes    Acute respiratory failure with hypoxia/ Atelectasis on CXR *At presentation patient was primarily complaining of shortness of breath and a productive cough which he describes as being dark in appearance but he says this is normal for him *Echocardiogram was completed this admission and this demonstrated preserved LV function, no evidence of right heart failure diastolic dysfunction *He continues to require nasal cannula oxygen but we will try to wean this Ambulate Incentive spirometry    Brief CHB (complete heart block)/ 1St degree AV block *Appreciate cardiology assistance *Pre-admission beta blocker remains on hold *Currently maintaining sinus rhythm *No indications for pacemaker at this time   Acute renal failure/ATN *Presenting BUN and creatinine were 40 and 1.9 and within 24 hours had increased to 36 and 2.34 but this was in the setting of acute sepsis and utilization of empiric Zosyn and vancomycin *Patient was also on diuretics, ACE inhibitor, NSAID and metformin prior to admission likely predisposing to ATN *Current creatinine has decreased to 1.03    Diabetes mellitus type 2, uncontrolled *Hemoglobin A1c this admission was 6.7 reflects a mean plasma glucose of 146 prior to admission *Currently only on sliding scale insulin *CBGs have peaked recently at 183 so we'll resume home glyburide but we'll continue to hold metformin because of renal function *Creatinine clearance is high enough  to continue metformin after discharge   HTN (hypertension) *Blood pressure is now 1  hypertensive range his since patient is off usual diuretic, ACE inhibitor and metoprolol *start Amlodipine, and resumed HCTZ *Last cardiology note from 05/25/2012 recommends continue to hold AV nodal blocking agent   DVT prophylaxis: Subcutaneous heparin 3 times a day Code Status: Full Family Communication: Spoke with patient Disposition Plan: inpatient   Consultants: Cardiology Infectious disease Wound ostomy care  Critical care medicine-signed all  Procedures: None  Antibiotics: Zosyn and Vancomycin 05/22/12>>>11/21  Clinda  11/19>>>11/21  Ancef 11/21>>>   HPI/Subjective: Patient alert and sitting up in chair. Breathing ok, still with some pain and drainage in LLE  Objective: Blood pressure 169/68, pulse 78, temperature 97.6 F (36.4 C), temperature source Oral, resp. rate 22, height 5\' 9"  (1.753 m), weight 143.926 kg (317 lb 4.8 oz), SpO2 95.00%.  Intake/Output Summary (Last 24 hours) at 05/29/12 4132 Last data filed at 05/29/12 4401  Gross per 24 hour  Intake    800 ml  Output   1775 ml  Net   -975 ml     Exam: General: No acute respiratory distress Lungs: Coarse but Clear to auscultation bilaterally without wheezes or crackles, nasal cannula oxygen at 3 L per minute Cardiovascular: Regular rate and rhythm without murmur gallop or rub normal S1 and S2, trace peripheral edema right lower extremity, significant pitting edema from the thigh to the toes of left lower extremity, no JVD Abdomen: Nontender, nondistended, soft, bowel sounds positive, no rebound, no ascites, no appreciable mass Musculoskeletal: No significant cyanosis, clubbing of bilateral lower extremities Neurological: Alert and oriented x3, moves all extremities x4 with a left lower strategy been limited by significant edema and pain, exam non focal Skin: Patient has ruby red erythema involving the left lower extremity below the knee and a circumferential sock-like pattern which extends to the foot and  irregularly to the toes, he also has multiple vesicular areas ranging from small to very large with superficial desquamation at areas where the vesicles have ruptured, there is one significant ulcerated wound in the anterior tibia, there is no purulent drainage  Data Reviewed: Basic Metabolic Panel:  Lab 05/28/12 0272 05/27/12 0500 05/26/12 0430 05/25/12 0510 05/24/12 0530  NA 141 139 145 141 138  K 3.3* 3.5 3.9 3.6 3.5  CL 102 101 105 104 103  CO2 28 29 29 25 20   GLUCOSE 141* 156* 175* 188* 160*  BUN 27* 31* 30* 31* 38*  CREATININE 1.03 1.12 1.25 1.23 1.62*  CALCIUM 8.8 8.8 8.8 8.6 8.1*  MG -- -- 2.0 2.2 1.9  PHOS -- -- 2.5 2.6 3.3   Liver Function Tests:  Lab 05/26/12 0430 05/23/12 0910  AST 63* 94*  ALT 78* 68*  ALKPHOS 82 69  BILITOT 0.4 0.4  PROT 7.2 6.6  ALBUMIN 2.3* 2.5*   No results found for this basename: LIPASE:5,AMYLASE:5 in the last 168 hours No results found for this basename: AMMONIA:5 in the last 168 hours CBC:  Lab 05/28/12 0540 05/27/12 0500 05/26/12 0430 05/25/12 0510 05/24/12 0530 05/22/12 1933  WBC 20.7* 18.2* 19.9* 23.6* 25.9* --  NEUTROABS -- -- -- 20.9* -- 26.6*  HGB 11.8* 11.6* 12.3* 11.8* 11.3* --  HCT 36.0* 34.5* 36.2* 35.2* 33.7* --  MCV 94.0 92.2 91.4 91.2 91.1 --  PLT 334 317 313 282 257 --   Cardiac Enzymes:  Lab 05/22/12 1933  CKTOTAL --  CKMB --  CKMBINDEX --  TROPONINI <0.30  BNP (last 3 results) No results found for this basename: PROBNP:3 in the last 8760 hours CBG:  Lab 05/29/12 0616 05/28/12 2339 05/28/12 1643 05/28/12 1126 05/28/12 0538  GLUCAP 171* 149* 162* 166* 140*    Recent Results (from the past 240 hour(s))  CULTURE, BLOOD (ROUTINE X 2)     Status: Normal (Preliminary result)   Collection Time   05/22/12  8:33 PM      Component Value Range Status Comment   Specimen Description BLOOD RIGHT HAND   Final    Special Requests BOTTLES DRAWN AEROBIC AND ANAEROBIC 3CC   Final    Culture  Setup Time 05/23/2012 08:55    Final    Culture     Final    Value:        BLOOD CULTURE RECEIVED NO GROWTH TO DATE CULTURE WILL BE HELD FOR 5 DAYS BEFORE ISSUING A FINAL NEGATIVE REPORT   Report Status PENDING   Incomplete   CULTURE, BLOOD (ROUTINE X 2)     Status: Normal (Preliminary result)   Collection Time   05/22/12  8:44 PM      Component Value Range Status Comment   Specimen Description BLOOD RIGHT HAND   Final    Special Requests BOTTLES DRAWN AEROBIC AND ANAEROBIC 5CC   Final    Culture  Setup Time 05/23/2012 08:54   Final    Culture     Final    Value:        BLOOD CULTURE RECEIVED NO GROWTH TO DATE CULTURE WILL BE HELD FOR 5 DAYS BEFORE ISSUING A FINAL NEGATIVE REPORT   Report Status PENDING   Incomplete   URINE CULTURE     Status: Normal   Collection Time   05/22/12  9:05 PM      Component Value Range Status Comment   Specimen Description URINE, CATHETERIZED   Final    Special Requests NONE   Final    Culture  Setup Time 05/23/2012 09:51   Final    Colony Count 35,000 COLONIES/ML   Final    Culture ENTEROBACTER CLOACAE   Final    Report Status 05/25/2012 FINAL   Final    Organism ID, Bacteria ENTEROBACTER CLOACAE   Final   MRSA PCR SCREENING     Status: Normal   Collection Time   05/23/12  2:31 AM      Component Value Range Status Comment   MRSA by PCR NEGATIVE  NEGATIVE Final   URINE CULTURE     Status: Normal   Collection Time   05/23/12  5:11 PM      Component Value Range Status Comment   Specimen Description URINE, CATHETERIZED   Final    Special Requests NONE   Final    Culture  Setup Time 05/24/2012 02:44   Final    Colony Count NO GROWTH   Final    Culture NO GROWTH   Final    Report Status 05/24/2012 FINAL   Final       Zannie Cove, MD [phone]

## 2012-05-29 NOTE — Progress Notes (Signed)
[facility] Progress Note [place] TEAM 1 - Stepdown/ICU Luis Carrillo ZOX:[id number] DOB: [birth year] DOA: 05/22/2012 PCP: No primary provider on file.  Brief narrative: 78 years old morbid obese male with PMH relevant for DM, HTN, dyslipidemia and chronic pain on narcotics. Presented with two weeks of worsening SOB, productive cough and more recently fever and chills. Over the last couple of days he developed LLE erythema and swelling. Does not recall trauma but there is a healing wound in the LLE. At the time of evaluation by the admitting physician the patient will awake, alert, oriented x3 and hemodynamically stable. At admission found to be briefly in complete heart block but at that time the admitting physician had arrived to assist the patient he had converted to a 2:1 AV block. Cardiology was consulted. UA was consistent with UTI. Denied N/V/D, urinary symptoms. No CP. He was subsequently admitted to the ICU by critical care medicine. He was felt to be septic related to his lower extremity cellulitis. Because of his complaints of coughing it was thought he initially had pneumonia he was started on broad-spectrum antibiotics. Cardiology evaluated the patient and AV nodal blocking agents were held and patient currently is maintaining sinus rhythm. No indications for pacemaker at this time. Infectious disease was consulted regarding the lower extremity cellulitis. Antibiotics have been narrowed to Ancef. Clindamycin was discontinued on 05/26/2012. He was subsequently transferred to the telemetry unit and team 1 assumed care of this patient on 05/27/2012.   Assessment/Plan: Principal Problem:  *Sepsis *Resolved and likely source from lower extremity cellulitis  Active Problems:  Leukocytosis/Lower extremity cellulitis *Antibiotics have been changed to Rocephin and Zithromax earlier today because of concerns of continued respiratory symptoms related to a community-acquired pneumonia  but infectious disease feels the patient does not have an pneumonia and has subsequently changed the patient back to Ancef and recommends 500 of Keflex 4 times a day at discharge for a total of 10 days of antibiotics-today is day #4 *Will increase when necessary Percocet to 1-2 tabs every 4 hours for moderate pain and add IV morphine every 2 hours when necessary for severe pain   Lower extremity venous stasis (left)/ Formation of vesicles left leg *Doppler studies this admission were negative for DVT *Because of evolution of lower extremity wounds with significant vesicular formation in the school may she of superficial skin we asked wound care to evaluate the patient and recommendations are to continue with current wound care   Acute respiratory failure with hypoxia/?? CAP (community acquired pneumonia) *At presentation patient was primarily complaining of shortness of breath and a productive cough which he describes as being dark in appearance but he says this is normal for him *Echocardiogram was completed this admission and this demonstrated preserved LV function, no evidence of right heart failure diastolic dysfunction *He continues to require nasal cannula oxygen but we will try to wean this    Brief CHB (complete heart block)/ 1St degree AV block *Appreciate cardiology assistance *Pre-admission beta blocker remains on hold *Currently maintaining sinus rhythm *No indications for pacemaker at this time   Acute renal failure/ATN *Presenting BUN and creatinine were 40 and 1.9 and within 24 hours had increased to 36 and 2.34 but this was in the setting of acute sepsis and utilization of empiric Zosyn and vancomycin *Patient was also on diuretics, ACE inhibitor, NSAID and metformin prior to admission likely predisposing to ATN *Current creatinine has decreased to 1.03     Diabetes mellitus type  2, uncontrolled *Hemoglobin A1c this admission was 6.7 reflects a mean plasma glucose of 146  prior to admission *Currently only on sliding scale insulin *CBGs have peaked recently at 183 so we'll resume home glyburide but we'll continue to hold metformin because of renal function *Creatinine clearance is high enough to continue metformin after discharge   HTN (hypertension) *Blood pressure is now 1 hypertensive range his since patient is off usual diuretic, ACE inhibitor and metoprolol *We'll begin Norvasc 10 mg daily and if blood pressure remains inadequately controlled over the next 48 hours can consider adding hydralazine or if renal function improves consider adding back ACE inhibitor *Last cardiology note from 05/25/2012 recommends continue to hold AV nodal blocking agent   DVT prophylaxis: Subcutaneous heparin 3 times a day Code Status: Full Family Communication: Spoke with patient Disposition Plan: Remain in telemetry  Consultants: Cardiology Infectious disease Wound ostomy care  Critical care medicine-signed all  Procedures: None  Antibiotics: Zosyn and Vancomycin 05/22/12>>>11/21  Clinda  11/19>>>11/21  Ancef 11/21>>>   HPI/Subjective: Patient alert and sitting up in chair. Breathing ok, still with some pain and drainage in LLE  Objective: Blood pressure 169/68, pulse 78, temperature 97.6 F (36.4 C), temperature source Oral, resp. rate 22, height 5\' 9"  (1.753 m), weight 143.926 kg (317 lb 4.8 oz), SpO2 95.00%.  Intake/Output Summary (Last 24 hours) at 05/29/12 0710 Last data filed at 05/29/12 0454  Gross per 24 hour  Intake   1040 ml  Output   2175 ml  Net  -1135 ml     Exam: General: No acute respiratory distress Lungs: Coarse but Clear to auscultation bilaterally without wheezes or crackles, nasal cannula oxygen at 3 L per minute Cardiovascular: Regular rate and rhythm without murmur gallop or rub normal S1 and S2, trace peripheral edema right lower extremity, significant pitting edema from the thigh to the toes of left lower extremity, no  JVD Abdomen: Nontender, nondistended, soft, bowel sounds positive, no rebound, no ascites, no appreciable mass Musculoskeletal: No significant cyanosis, clubbing of bilateral lower extremities Neurological: Alert and oriented x3, moves all extremities x4 with a left lower strategy been limited by significant edema and pain, exam non focal Skin: Patient has ruby red erythema involving the left lower extremity below the knee and a circumferential sock-like pattern which extends to the foot and irregularly to the toes, he also has multiple vesicular areas ranging from small to very large with superficial desquamation at areas where the vesicles have ruptured, there is one significant ulcerated wound in the anterior tibia, there is no purulent drainage  Data Reviewed: Basic Metabolic Panel:  Lab 05/28/12 0981 05/27/12 0500 05/26/12 0430 05/25/12 0510 05/24/12 0530  NA 141 139 145 141 138  K 3.3* 3.5 3.9 3.6 3.5  CL 102 101 105 104 103  CO2 28 29 29 25 20   GLUCOSE 141* 156* 175* 188* 160*  BUN 27* 31* 30* 31* 38*  CREATININE 1.03 1.12 1.25 1.23 1.62*  CALCIUM 8.8 8.8 8.8 8.6 8.1*  MG -- -- 2.0 2.2 1.9  PHOS -- -- 2.5 2.6 3.3   Liver Function Tests:  Lab 05/26/12 0430 05/23/12 0910  AST 63* 94*  ALT 78* 68*  ALKPHOS 82 69  BILITOT 0.4 0.4  PROT 7.2 6.6  ALBUMIN 2.3* 2.5*   No results found for this basename: LIPASE:5,AMYLASE:5 in the last 168 hours No results found for this basename: AMMONIA:5 in the last 168 hours CBC:  Lab 05/28/12 0540 05/27/12 0500 05/26/12 0430  05/25/12 0510 05/24/12 0530 05/22/12 1933  WBC 20.7* 18.2* 19.9* 23.6* 25.9* --  NEUTROABS -- -- -- 20.9* -- 26.6*  HGB 11.8* 11.6* 12.3* 11.8* 11.3* --  HCT 36.0* 34.5* 36.2* 35.2* 33.7* --  MCV 94.0 92.2 91.4 91.2 91.1 --  PLT 334 317 313 282 257 --   Cardiac Enzymes:  Lab 05/22/12 1933  CKTOTAL --  CKMB --  CKMBINDEX --  TROPONINI <0.30   BNP (last 3 results) No results found for this basename: PROBNP:3 in  the last 8760 hours CBG:  Lab 05/29/12 0616 05/28/12 2339 05/28/12 1643 05/28/12 1126 05/28/12 0538  GLUCAP 171* 149* 162* 166* 140*    Recent Results (from the past 240 hour(s))  CULTURE, BLOOD (ROUTINE X 2)     Status: Normal (Preliminary result)   Collection Time   05/22/12  8:33 PM      Component Value Range Status Comment   Specimen Description BLOOD RIGHT HAND   Final    Special Requests BOTTLES DRAWN AEROBIC AND ANAEROBIC 3CC   Final    Culture  Setup Time 05/23/2012 08:55   Final    Culture     Final    Value:        BLOOD CULTURE RECEIVED NO GROWTH TO DATE CULTURE WILL BE HELD FOR 5 DAYS BEFORE ISSUING A FINAL NEGATIVE REPORT   Report Status PENDING   Incomplete   CULTURE, BLOOD (ROUTINE X 2)     Status: Normal (Preliminary result)   Collection Time   05/22/12  8:44 PM      Component Value Range Status Comment   Specimen Description BLOOD RIGHT HAND   Final    Special Requests BOTTLES DRAWN AEROBIC AND ANAEROBIC 5CC   Final    Culture  Setup Time 05/23/2012 08:54   Final    Culture     Final    Value:        BLOOD CULTURE RECEIVED NO GROWTH TO DATE CULTURE WILL BE HELD FOR 5 DAYS BEFORE ISSUING A FINAL NEGATIVE REPORT   Report Status PENDING   Incomplete   URINE CULTURE     Status: Normal   Collection Time   05/22/12  9:05 PM      Component Value Range Status Comment   Specimen Description URINE, CATHETERIZED   Final    Special Requests NONE   Final    Culture  Setup Time 05/23/2012 09:51   Final    Colony Count 35,000 COLONIES/ML   Final    Culture ENTEROBACTER CLOACAE   Final    Report Status 05/25/2012 FINAL   Final    Organism ID, Bacteria ENTEROBACTER CLOACAE   Final   MRSA PCR SCREENING     Status: Normal   Collection Time   05/23/12  2:31 AM      Component Value Range Status Comment   MRSA by PCR NEGATIVE  NEGATIVE Final   URINE CULTURE     Status: Normal   Collection Time   05/23/12  5:11 PM      Component Value Range Status Comment   Specimen  Description URINE, CATHETERIZED   Final    Special Requests NONE   Final    Culture  Setup Time 05/24/2012 02:44   Final    Colony Count NO GROWTH   Final    Culture NO GROWTH   Final    Report Status 05/24/2012 FINAL   Final       Zannie Cove, MD [phone]

## 2012-05-29 NOTE — Clinical Social Work Placement (Signed)
CSW consulted re: d/c planning. Patient does not have bed offers at this time. Weekday CSW will follow up Monday re: SNF placement for d/c.   Lia Foyer, LCSWA [facility] Clinical Social Worker Contact #: [phone] (weekend)

## 2012-05-30 LAB — GLUCOSE, CAPILLARY
Glucose-Capillary: 161 mg/dL — ABNORMAL HIGH (ref 70–99)
Glucose-Capillary: 168 mg/dL — ABNORMAL HIGH (ref 70–99)
Glucose-Capillary: 168 mg/dL — ABNORMAL HIGH (ref 70–99)

## 2012-05-30 LAB — CBC
Hemoglobin: 12.6 g/dL — ABNORMAL LOW (ref 13.0–17.0)
MCH: 30.9 pg (ref 26.0–34.0)
Platelets: 349 10*3/uL (ref 150–400)
RBC: 4.08 MIL/uL — ABNORMAL LOW (ref 4.22–5.81)
WBC: 16.3 10*3/uL — ABNORMAL HIGH (ref 4.0–10.5)

## 2012-05-30 LAB — BASIC METABOLIC PANEL
CO2: 27 mEq/L (ref 19–32)
Chloride: 100 mEq/L (ref 96–112)
Glucose, Bld: 167 mg/dL — ABNORMAL HIGH (ref 70–99)
Potassium: 3.5 mEq/L (ref 3.5–5.1)
Sodium: 140 mEq/L (ref 135–145)

## 2012-05-30 MED ORDER — HYDRALAZINE HCL 25 MG PO TABS
25.0000 mg | ORAL_TABLET | Freq: Three times a day (TID) | ORAL | Status: DC
  Administered 2012-05-30 – 2012-05-31 (×5): 25 mg via ORAL
  Filled 2012-05-30 (×7): qty 1

## 2012-05-30 MED ORDER — ATORVASTATIN CALCIUM 40 MG PO TABS
40.0000 mg | ORAL_TABLET | Freq: Every day | ORAL | Status: DC
  Administered 2012-05-30: 40 mg via ORAL
  Filled 2012-05-30 (×2): qty 1

## 2012-05-30 MED ORDER — ZOLPIDEM TARTRATE 5 MG PO TABS: 5.0000 mg | ORAL_TABLET | Freq: Every evening | ORAL | Status: DC | PRN

## 2012-05-30 MED ORDER — GLIPIZIDE ER 10 MG PO TB24
10.0000 mg | ORAL_TABLET | Freq: Two times a day (BID) | ORAL | Status: DC
  Administered 2012-05-30 – 2012-05-31 (×2): 10 mg via ORAL
  Filled 2012-05-30 (×4): qty 1

## 2012-05-30 NOTE — Progress Notes (Signed)
[facility] Progress Note    Luis Carrillo ZOX:[id number] DOB: May 04, 1931 DOA: 05/22/2012 PCP: No primary provider on file.  Brief narrative: 78 years old morbid obese male with PMH relevant for DM, HTN, dyslipidemia and chronic pain on narcotics. Presented with two weeks of worsening SOB, productive cough and more recently fever and chills. Over the last couple of days he developed LLE erythema and swelling. Does not recall trauma but there is a healing wound in the LLE. At the time of evaluation by the admitting physician the patient will awake, alert, oriented x3 and hemodynamically stable. At admission found to be briefly in complete heart block but at that time the admitting physician had arrived to assist the patient he had converted to a 2:1 AV block. Cardiology was consulted. UA was consistent with UTI. Denied N/V/D, urinary symptoms. No CP. He was subsequently admitted to the ICU by critical care medicine. He was felt to be septic related to his lower extremity cellulitis. Because of his complaints of coughing it was thought he initially had pneumonia he was started on broad-spectrum antibiotics. Cardiology evaluated the patient and AV nodal blocking agents were held and patient currently is maintaining sinus rhythm. No indications for pacemaker at this time. Infectious disease was consulted regarding the lower extremity cellulitis. Antibiotics have been narrowed to Ancef. Clindamycin was discontinued on 05/26/2012. He was subsequently transferred to the telemetry unit and team 1 assumed care of this patient on 05/27/2012. Txed to [facility] 11/23   Assessment/Plan: Principal Problem:  *Sepsis *Resolved and likely source from lower extremity cellulitis  Active Problems:  Leukocytosis/Lower extremity cellulitis *Was initially on Rocephin/zithromax, Infectious disease felt that patient does not have an pneumonia and subsequently started the patient back on Ancef, continue this till 11/26 and  then transition to Keflex 4 times  at discharge for a total of 10 days of antibiotics-today is day #7 *Continue Percocet to 1-2 tabs every 4 hours PRN   Lower extremity venous stasis (left)/ Formation of vesicles left leg *Doppler studies this admission were negative for DVT *Because of evolution of lower extremity wounds with significant blister formation then rupture and skin loss, seen by wound care, continue dressing changes    Acute respiratory failure with hypoxia/ Atelectasis on CXR *At presentation patient was primarily complaining of shortness of breath and a productive cough which he describes as being dark in appearance but he says this is normal for him *Echocardiogram was completed this admission and this demonstrated preserved LV function, no evidence of right heart failure diastolic dysfunction *He continues to require nasal cannula oxygen but we will try to wean this Ambulate Incentive spirometry   Brief CHB (complete heart block)/ 1St degree AV block *Appreciate cardiology assistance *Pre-admission beta blocker remains on hold *Currently maintaining sinus rhythm *No indications for pacemaker at this time   Acute renal failure/ATN *Presenting BUN and creatinine were 40 and 1.9 and within 24 hours had increased to 36 and 2.34 but this was in the setting of acute sepsis and utilization of empiric Zosyn and vancomycin *Patient was also on diuretics, ACE inhibitor, NSAID and metformin prior to admission likely predisposing to ATN *Current creatinine has decreased to 1.03    Diabetes mellitus type 2, uncontrolled *Hemoglobin A1c this admission was 6.7 reflects a mean plasma glucose of 146 prior to admission *Currently only on sliding scale insulin *CBGs have peaked recently at 183 so we'll resume home glyburide but we'll continue to hold metformin because of renal function *Creatinine clearance is  high enough to continue metformin after discharge   HTN  (hypertension) *Blood pressure is now 1 hypertensive range his since patient is off usual diuretic, ACE inhibitor and metoprolol *started Amlodipine, and resumed HCTZ, add hydralazine *Last cardiology note from 05/25/2012 recommends continue to hold AV nodal blocking agent   DVT prophylaxis: Subcutaneous heparin 3 times a day Code Status: Full Family Communication: Spoke with patient Disposition Plan: needs SNF   Consultants: Cardiology Infectious disease Wound ostomy care  Critical care medicine-signed all  Procedures: None  Antibiotics: Zosyn and Vancomycin 05/22/12>>>11/21  Clinda  11/19>>>11/21  Ancef 11/21>>>   HPI/Subjective: Patient alert and sitting up in chair. Breathing ok, still with some pain and drainage in LLE, restless leg last pm  Objective: Blood pressure 183/67, pulse 80, temperature 97.6 F (36.4 C), temperature source Oral, resp. rate 18, height 5\' 9"  (1.753 m), weight 142.339 kg (313 lb 12.8 oz), SpO2 96.00%.  Intake/Output Summary (Last 24 hours) at 05/30/12 1610 Last data filed at 05/30/12 0849  Gross per 24 hour  Intake   1250 ml  Output   2350 ml  Net  -1100 ml     Exam: General: No acute respiratory distress Lungs: Coarse but Clear to auscultation bilaterally without wheezes or crackles, nasal cannula oxygen at 3 L per minute Cardiovascular: Regular rate and rhythm without murmur gallop or rub normal S1 and S2, trace peripheral edema right lower extremity, significant pitting edema from the thigh to the toes of left lower extremity, no JVD Abdomen: Nontender, nondistended, soft, bowel sounds positive, no rebound, no ascites, no appreciable mass Musculoskeletal: No significant cyanosis, clubbing of bilateral lower extremities Neurological: Alert and oriented x3, moves all extremities x4 with a left lower strategy been limited by significant edema and pain, exam non focal Skin: Patient has erythema involving the left lower extremity below the  knee and a circumferential sock-like pattern which extends to the foot and irregularly to the toes, he also has superficial desquamation at areas where the vesicles have ruptured, there is one significant ulcerated wound in the anterior tibia, there is no purulent drainage  Data Reviewed: Basic Metabolic Panel:  Lab 05/30/12 9604 05/29/12 0700 05/28/12 0540 05/27/12 0500 05/26/12 0430 05/25/12 0510 05/24/12 0530  NA 140 139 141 139 145 -- --  K 3.5 3.5 3.3* 3.5 3.9 -- --  CL 100 100 102 101 105 -- --  CO2 27 25 28 29 29  -- --  GLUCOSE 167* 149* 141* 156* 175* -- --  BUN 22 22 27* 31* 30* -- --  CREATININE 1.05 0.97 1.03 1.12 1.25 -- --  CALCIUM 9.0 8.9 8.8 8.8 8.8 -- --  MG -- -- -- -- 2.0 2.2 1.9  PHOS -- -- -- -- 2.5 2.6 3.3   Liver Function Tests:  Lab 05/26/12 0430 05/23/12 0910  AST 63* 94*  ALT 78* 68*  ALKPHOS 82 69  BILITOT 0.4 0.4  PROT 7.2 6.6  ALBUMIN 2.3* 2.5*   No results found for this basename: LIPASE:5,AMYLASE:5 in the last 168 hours No results found for this basename: AMMONIA:5 in the last 168 hours CBC:  Lab 05/30/12 0600 05/29/12 0700 05/28/12 0540 05/27/12 0500 05/26/12 0430 05/25/12 0510  WBC 16.3* 19.4* 20.7* 18.2* 19.9* --  NEUTROABS -- -- -- -- -- 20.9*  HGB 12.6* 11.8* 11.8* 11.6* 12.3* --  HCT 37.9* 36.0* 36.0* 34.5* 36.2* --  MCV 92.9 92.1 94.0 92.2 91.4 --  PLT 349 325 334 317 313 --   Cardiac  Enzymes: No results found for this basename: CKTOTAL:5,CKMB:5,CKMBINDEX:5,TROPONINI:5 in the last 168 hours BNP (last 3 results) No results found for this basename: PROBNP:3 in the last 8760 hours CBG:  Lab 05/30/12 0549 05/29/12 2122 05/29/12 1612 05/29/12 1112 05/29/12 0616  GLUCAP 161* 156* 180* 193* 171*    Recent Results (from the past 240 hour(s))  CULTURE, BLOOD (ROUTINE X 2)     Status: Normal   Collection Time   05/22/12  8:33 PM      Component Value Range Status Comment   Specimen Description BLOOD RIGHT HAND   Final    Special  Requests BOTTLES DRAWN AEROBIC AND ANAEROBIC 3CC   Final    Culture  Setup Time 05/23/2012 08:55   Final    Culture NO GROWTH 5 DAYS   Final    Report Status 05/29/2012 FINAL   Final   CULTURE, BLOOD (ROUTINE X 2)     Status: Normal   Collection Time   05/22/12  8:44 PM      Component Value Range Status Comment   Specimen Description BLOOD RIGHT HAND   Final    Special Requests BOTTLES DRAWN AEROBIC AND ANAEROBIC 5CC   Final    Culture  Setup Time 05/23/2012 08:54   Final    Culture NO GROWTH 5 DAYS   Final    Report Status 05/29/2012 FINAL   Final   URINE CULTURE     Status: Normal   Collection Time   05/22/12  9:05 PM      Component Value Range Status Comment   Specimen Description URINE, CATHETERIZED   Final    Special Requests NONE   Final    Culture  Setup Time 05/23/2012 09:51   Final    Colony Count 35,000 COLONIES/ML   Final    Culture ENTEROBACTER CLOACAE   Final    Report Status 05/25/2012 FINAL   Final    Organism ID, Bacteria ENTEROBACTER CLOACAE   Final   MRSA PCR SCREENING     Status: Normal   Collection Time   05/23/12  2:31 AM      Component Value Range Status Comment   MRSA by PCR NEGATIVE  NEGATIVE Final   URINE CULTURE     Status: Normal   Collection Time   05/23/12  5:11 PM      Component Value Range Status Comment   Specimen Description URINE, CATHETERIZED   Final    Special Requests NONE   Final    Culture  Setup Time 05/24/2012 02:44   Final    Colony Count NO GROWTH   Final    Culture NO GROWTH   Final    Report Status 05/24/2012 FINAL   Final       Zannie Cove, MD [phone]

## 2012-05-30 NOTE — Progress Notes (Signed)
Physical Therapy Treatment Patient Details Name: Luis Carrillo MRN: [id number] DOB: August 04, 1930 Today's Date: 05/30/2012 Time: 9562-1308 PT Time Calculation (min): 39 min  PT Assessment / Plan / Recommendation Comments on Treatment Session  78 years old morbid obese male with PMH relevant for DM, HTN, dyslipidemia and chronic pain on narcotics. Presented with two weeks of worsening SOB, productive cough and more recently fever and chills. Over the last couple of days he developed LLE erythema and swelling. Does not recall trauma but there is a healing wound in the LLE.  Dx with breif complete heart block, lower extremity cellulitis and venous stasis ulcer, acute respiratory failure, and acute renal failure.  The pt presents today with improved mobility, but still very limited acitivy tolerance with increased DOE with short distance gait and chair level exercises.  O2 sats remained above 90 despite dyspnea on RA.  Continues to be a high fall risk requiring min assist to prevent LOB during gait while turning RW.  SNF level rehab appropriate at d/c.      Follow Up Recommendations  SNF     Does the patient have the potential to tolerate intense rehabilitation   NA  Barriers to Discharge  none      Equipment Recommendations  Rolling walker with 5" wheels    Recommendations for Other Services  none  Frequency Min 3X/week   Plan Discharge plan remains appropriate;Frequency remains appropriate    Precautions / Restrictions Precautions Precautions: Fall Precaution Comments: monitor O2 sats.  Did well on RA with gait today, DOE increased to 3/4   Pertinent Vitals/Pain Pain 8/10 left lower leg, pt premedicated 1 hour prior to tx (per pt report), leg elevated on pillow after gait and re-wrapped before gait.  O2 sats on RA at rest 91%, after gait sitting in recliner 97% on RA DOE during gait and chair exercises 3/4.  2.5L O2 [place] re-applied to nose after gait.      Mobility  Bed Mobility Bed Mobility:  Not assessed Details for Bed Mobility Assistance: pt up in recliner Transfers Sit to Stand: 5: Supervision;With upper extremity assist;With armrests;From chair/3-in-1 Stand to Sit: 5: Supervision;With upper extremity assist;With armrests;To chair/3-in-1 Details for Transfer Assistance: supervision for safety due to pt needed to use momentum to get to standing and has uncontrolled descent to sit.   Ambulation/Gait Ambulation/Gait Assistance: 4: Min assist Ambulation Distance (Feet): 35 Feet Assistive device: Rolling walker Ambulation/Gait Assistance Details: cues for tall posture and to stay closer to RW for support and stability.  Gait Pattern: Step-through pattern;Shuffle;Trunk flexed Gait velocity: less than 1.8 ft/sec putting him at risk for recurrent falls.   General Gait Details: pt with increased DOE with gait on RA, O2 sats remained in the low 90s with quick recovery into the upper 90s once seated on RA.  Chair to follow to encourage increased gait distance and no rest break with gait today.      Exercises General Exercises - Upper Extremity Shoulder Flexion: AROM;Both;10 reps;Seated Elbow Flexion: AROM;Both;10 reps;Seated General Exercises - Lower Extremity Long Arc Quad: AROM;Both;10 reps;Seated (5 reps normal, 5 resps with 5 second holds) Hip Flexion/Marching: AROM;Both;10 reps;Seated Toe Raises: AROM;Both;10 reps;Seated Heel Raises: AROM;Both;10 reps;Seated     PT Goals Acute Rehab PT Goals PT Goal: Sit to Stand - Progress: Progressing toward goal PT Goal: Stand to Sit - Progress: Progressing toward goal PT Goal: Ambulate - Progress: Progressing toward goal PT Goal: Perform Home Exercise Program - Progress: Progressing toward goal  Visit Information  Last PT Received On: 05/30/12 Assistance Needed: +2 (chair to follow)    Subjective Data  Subjective: Pt reports that his breathing feels better than when he came into the hospital.     Cognition  Overall Cognitive  Status: Appears within functional limits for tasks assessed/performed       End of Session PT - End of Session Activity Tolerance: Patient limited by fatigue Patient left: in chair;with family/visitor present (daughter assisting with treatment) Nurse Communication: Other (comment) (told RN tech about urine in bathroom for I&Os)        Lurena Joiner B. Bhakti Labella, PT, DPT [phone]   05/30/2012, 10:28 AM

## 2012-05-30 NOTE — Consult Note (Signed)
Wound care follow-up:  Left leg with previous erythremia and edema receeding.  Previous blisters have all ruptured and wound bed is pink and moist with mod yellow drainage, no odor. Posterior leg with partial thickness wounds in patchy area approx 4X4X.1cm. Weeping can be contained by foam dressings at this point, will d/cm previous light compression orders. Family member at bedside and pt states how much the appearence has improved. Will not plan to follow further unless re-consulted.  [address] RN, MSN, [employer]  [phone]

## 2012-05-31 DIAGNOSIS — L0390 Cellulitis, unspecified: Secondary | ICD-10-CM

## 2012-05-31 DIAGNOSIS — L0291 Cutaneous abscess, unspecified: Secondary | ICD-10-CM

## 2012-05-31 DIAGNOSIS — J9600 Acute respiratory failure, unspecified whether with hypoxia or hypercapnia: Secondary | ICD-10-CM

## 2012-05-31 LAB — BASIC METABOLIC PANEL
Calcium: 9.2 mg/dL (ref 8.4–10.5)
GFR calc Af Amer: 72 mL/min — ABNORMAL LOW (ref >90)
GFR calc non Af Amer: 62 mL/min — ABNORMAL LOW (ref >90)
Glucose, Bld: 124 mg/dL — ABNORMAL HIGH (ref 70–99)
Potassium: 3.7 mEq/L (ref 3.5–5.1)
Sodium: 138 mEq/L (ref 135–145)

## 2012-05-31 LAB — CBC
Hemoglobin: 12.2 g/dL — ABNORMAL LOW (ref 13.0–17.0)
MCH: 30.9 pg (ref 26.0–34.0)
MCHC: 33.4 g/dL (ref 30.0–36.0)
Platelets: 318 10*3/uL (ref 150–400)
RDW: 14.2 % (ref 11.5–15.5)

## 2012-05-31 MED ORDER — ZOLPIDEM TARTRATE 5 MG PO TABS: 5.0000 mg | ORAL_TABLET | Freq: Every evening | ORAL | Status: DC | PRN

## 2012-05-31 MED ORDER — AMLODIPINE BESYLATE 10 MG PO TABS: 10.0000 mg | ORAL_TABLET | Freq: Every day | ORAL | Status: DC

## 2012-05-31 MED ORDER — OXYCODONE-ACETAMINOPHEN 5-325 MG PO TABS: 1.0000 | ORAL_TABLET | ORAL | Status: DC | PRN

## 2012-05-31 MED ORDER — CEPHALEXIN 500 MG PO CAPS: 500.0000 mg | ORAL_CAPSULE | Freq: Four times a day (QID) | ORAL | Status: DC

## 2012-05-31 MED ORDER — ALBUTEROL SULFATE HFA 108 (90 BASE) MCG/ACT IN AERS: 4.0000 | INHALATION_SPRAY | RESPIRATORY_TRACT | Status: DC | PRN

## 2012-05-31 MED ORDER — FUROSEMIDE 20 MG PO TABS: 20.0000 mg | ORAL_TABLET | Freq: Two times a day (BID) | ORAL | Status: DC

## 2012-05-31 MED ORDER — HYDRALAZINE HCL 25 MG PO TABS: 25.0000 mg | ORAL_TABLET | Freq: Three times a day (TID) | ORAL | Status: DC

## 2012-05-31 NOTE — Discharge Summary (Signed)
Physician Discharge Summary  Patient ID: Luis Carrillo MRN: [id number] DOB/AGE: 1930-09-04 78 y.o.  Admit date: 05/22/2012 Discharge date: 05/31/2012  Primary Care Physician:  Gentry Fitz  FU 1. PCP in 1 week 2. Dr.Brackbill or cardiologist of choice in 1-2 weeks   Discharge Diagnoses:    Sepsis  Acute respiratory failure with hypoxia  Leukocytosis  Lower extremity cellulitis  Lower extremity venous stasis (left)  Formation of vesicles left leg  Diabetes mellitus type 2, uncontrolled  HTN (hypertension)  Brief CHB (complete heart block)  1St degree AV block  Acute renal failure resolved  Chronic pain  Morbid obesity Suspected OSA: needs sleep study       Medication List     As of 05/31/2012 10:14 AM    STOP taking these medications         hydrochlorothiazide 25 MG tablet   Commonly known as: HYDRODIURIL      lisinopril 20 MG tablet   Commonly known as: PRINIVIL,ZESTRIL      meloxicam 15 MG tablet   Commonly known as: MOBIC      metoprolol 50 MG tablet   Commonly known as: LOPRESSOR      TAKE these medications         albuterol 108 (90 BASE) MCG/ACT inhaler   Commonly known as: PROVENTIL HFA;VENTOLIN HFA   Inhale 4 puffs into the lungs every 2 (two) hours as needed for wheezing.      amLODipine 10 MG tablet   Commonly known as: NORVASC   Take 1 tablet (10 mg total) by mouth daily.      atorvastatin 40 MG tablet   Commonly known as: LIPITOR   Take 40 mg by mouth daily.      cephALEXin 500 MG capsule   Commonly known as: KEFLEX   Take 1 capsule (500 mg total) by mouth 4 (four) times daily. For 5 days      glipiZIDE 10 MG 24 hr tablet   Commonly known as: GLUCOTROL XL   Take 10 mg by mouth 2 (two) times daily.      hydrALAZINE 25 MG tablet   Commonly known as: APRESOLINE   Take 1 tablet (25 mg total) by mouth every 8 (eight) hours.      metFORMIN 1000 MG tablet   Commonly known as: GLUCOPHAGE   Take 1,000 mg by mouth 2 (two) times daily with a  meal.      multivitamin with minerals Tabs   Take 1 tablet by mouth daily.      oxyCODONE-acetaminophen 5-325 MG per tablet   Commonly known as: PERCOCET/ROXICET   Take 1-2 tablets by mouth every 4 (four) hours as needed.      oxyCODONE-acetaminophen 10-325 MG per tablet   Commonly known as: PERCOCET   Take 1 tablet by mouth every 4 (four) hours as needed. pain      potassium chloride 10 MEQ tablet   Commonly known as: K-DUR,KLOR-CON   Take 10 mEq by mouth 2 (two) times daily.      vitamin E 400 UNIT capsule   Take 400 Units by mouth daily.      zolpidem 5 MG tablet   Commonly known as: AMBIEN   Take 1 tablet (5 mg total) by mouth at bedtime as needed for sleep.        Consults:  Consultants:  [facility]  Infectious disease  Wound ostomy care  Critical care medicine   Significant Diagnostic Studies:  PORTABLE CHEST - 1 VIEW  IMPRESSION: Bibasilar atelectasis verses airspace disease is worse.   MRI OF THE LEFT LOWER LEG WITHOUT CONTRAST IMPRESSION: Diffuse subcutaneous edema consistent with cellulitis. No myositis, osteomyelitis, or abscess.   ECHO Study Conclusions - Left ventricle: The cavity size was normal. Systolic function was normal. The estimated ejection fraction was in the range of 60% to 65%. Although no diagnostic regional wall motion abnormality was identified, this possibility cannot be completely excluded on the basis of this study. - Aortic valve: There was mild stenosis. Valve area: 1.93cm^2(VTI). Valve area: 1.98cm^2 (Vmax). - Mitral valve: Mild regurgitation. - Left atrium: The atrium was mildly dilated     Brief H and P: 78 years old morbid obese male with PMH relevant for DM, HTN, dyslipidemia and chronic pain on narcotics. Presents with two weeks of worsening SOB, productive cough and more recently fever and chills. Over the last couple of days developed LLE erythema and swelling. Does not recall trauma but there is a healing  wound in the LLE. At the time of my exam awake, alert, oriented x3 and hemodynamically stable. At admission found to be briefly on complete heart block but now 2:1 AV block. Cardiology was consulted. UA found consistent with UTI. Denies N/V/D, urinary symptoms. No CP.  Hospital Course:  Admitted with Sepsis to ICU, at admission found to be briefly in complete heart block but at that time the admitting physician had arrived to assist the patient he had converted to a 2:1 AV block. Cardiology was consulted.  He was felt to be septic related to his lower extremity cellulitis.  Cardiology evaluated the patient and AV nodal blocking agents were held and patient currently is maintaining sinus rhythm. No indications for pacemaker at this time. Infectious disease was consulted regarding the lower extremity cellulitis. Antibiotics have been narrowed to Ancef. Clindamycin was discontinued on 05/26/2012. He was subsequently transferred to the telemetry unit.  Sepsis  *Resolved and likely source from lower extremity cellulitis   Leukocytosis/Lower extremity cellulitis  *Was initially on Rocephin/zithromax, Infectious disease felt that patient does not have an pneumonia and subsequently started the patient back on Ancef, continue this till 11/26 and then transition to Keflex 4 times at discharge for a total of 14 days of antibiotics-today is day #9   *Continue Percocet to 1-2 tabs every 4 hours PRN   Lower extremity venous stasis (left)/ Formation of vesicles left leg  *Doppler studies this admission were negative for DVT  *Because of evolution of lower extremity wounds with significant blister formation then rupture and skin loss, seen by wound care, continue dressing changes.  Acute respiratory failure with hypoxia/  Atelectasis on CXR  *At presentation patient was primarily complaining of shortness of breath and a productive cough which he describes as being dark in appearance but he says this is normal for  him , briefly required BIPAP *Echocardiogram was completed this admission and this demonstrated EF of 60-65%, no evidence of right heart failure diastolic dysfunction  *He continues to require nasal cannula oxygen but we will try to wean this  Ambulate , using Incentive spirometry   Brief CHB (complete heart block)/ 1St degree AV block : transient on admission resolved with holding betablocker *Appreciate cardiology assistance  *Pre-admission beta blocker remains on hold  *Currently maintaining sinus rhythm  *No indications for pacemaker at this time  FU with cardiology in 1-2 weeks  Acute renal failure/ATN : resolved *Presenting BUN and creatinine were 40 and 1.9 and within 24 hours had increased to  36 and 2.34 but this was in the setting of acute sepsis and utilization of empiric Zosyn and vancomycin  *Patient was also on diuretics, ACE inhibitor, NSAID and metformin prior to admission likely predisposing to ATN  *Current creatinine has decreased to 1.03   Diabetes mellitus type 2, uncontrolled  *Hemoglobin A1c this admission was 6.7 reflects a mean plasma glucose of 146 prior to admission  *Currently only on sliding scale insulin  Continue glyburide and metformin at discharge  HTN (hypertension)  *Blood pressure is now 1 hypertensive range his since patient is off usual diuretic, ACE inhibitor and metoprolol  *started Amlodipine, added hydralazine and on low dose diuretic now *Last cardiology note from 05/25/2012 recommends continue to hold AV nodal blocking agent   Discharge condition: stable, Temp 97.7, HR 82, BP 142/57, RR: 18, Sats 95%on 2L Discharge diet: low Na heart healthy    Time spent on Discharge:  Signed: Jodi Criscuolo [facility]  05/31/2012, 10:14 AM

## 2012-05-31 NOTE — Progress Notes (Signed)
   CARE MANAGEMENT NOTE 05/31/2012  Patient:  Luis Carrillo, Luis Carrillo   Account Number:  [id number]  Date Initiated:  05/23/2012  Documentation initiated by:  Junius Creamer  Subjective/Objective Assessment:   adm w pneumonia     Action/Plan:   lives w wife  05/31/2012 Plan to d/c to SNF   Anticipated DC Date:  05/31/2012   Anticipated DC Plan:  SKILLED NURSING FACILITY  In-house referral  Clinical Social Worker      DC Planning Services  CM consult      Choice offered to / List presented to:             Status of service:  Completed, signed off Medicare Important Message given?   (If response is "NO", the following Medicare IM given date fields will be blank) Date Medicare IM given:   Date Additional Medicare IM given:    Discharge Disposition:  SKILLED NURSING FACILITY  Per UR Regulation:  Reviewed for med. necessity/level of care/duration of stay  If discussed at Long Length of Stay Meetings, dates discussed:    Comments:  11/20 10:21a debbie dowell rn,bsn md notes states he has spoken w da and pt will need snf.  11/18 12n debbie dowell rn,bsn [phone]

## 2012-05-31 NOTE — Progress Notes (Signed)
Physical Therapy Treatment Patient Details Name: Luis Carrillo MRN: [id number] DOB: May 21, 1931 Today's Date: 05/31/2012 Time: 4098-1191 PT Time Calculation (min): 20 min  PT Assessment / Plan / Recommendation Comments on Treatment Session  Pt. admitted with sepsis, LLE erythema & swelling, and acute respiratory failure; Pt. progressing with mobility and motivated to get up and ambulating; able to ambulate 120' compared to 35' last tx. Pt. asking what he needs to do to improve current condition and to get strong enough to go to restroom when cathater removed. Pt. SOB with ambulation but had to be encouraged to take rest breaks.    Follow Up Recommendations  SNF           Equipment Recommendations  Rolling walker with 5" wheels       Frequency Min 3X/week   Plan Discharge plan remains appropriate;Frequency remains appropriate    Precautions / Restrictions Precautions Precautions: Fall Restrictions Weight Bearing Restrictions: No   Pertinent Vitals/Pain No pain reported O2 at 93% on 2L; dropped to 83% at lowest during ambulation; 95% after ambulation with pt in chair    Mobility  Bed Mobility Bed Mobility: Not assessed Transfers Sit to Stand: 5: Supervision Stand to Sit: 5: Supervision Details for Transfer Assistance: Supervision for safety. Need cueing to control descent to chair. Ambulation/Gait Ambulation/Gait Assistance: 4: Min assist Ambulation Distance (Feet): 120 Feet Assistive device: Rolling walker Ambulation/Gait Assistance Details: Pt. required cueing to stand upright, stay inside RW, and to take rest breaks. Pt. SOB during ambulation but did not ask to take rest breaks; PT asked pt. to take standing rest break x2 to check O2. Pt. O2 at 83% at lowest. Pt. increases gait speed with fatigue and trys to rush to get done sooner.  Gait Pattern: Step-through pattern;Decreased stride length;Trunk flexed Stairs: No    Exercises General Exercises - Lower Extremity Long Arc  Quad: AROM;Both;20 reps;Seated Hip Flexion/Marching: AROM;Both;20 reps;Seated    PT Goals Acute Rehab PT Goals PT Goal: Sit to Stand - Progress: Progressing toward goal PT Goal: Stand to Sit - Progress: Progressing toward goal PT Goal: Ambulate - Progress: Progressing toward goal PT Goal: Perform Home Exercise Program - Progress: Progressing toward goal  Visit Information  Last PT Received On: 05/31/12 Assistance Needed: +1    Subjective Data  Subjective: "I'm mad at myself for getting in this shape."   Cognition  Overall Cognitive Status: Appears within functional limits for tasks assessed/performed Arousal/Alertness: Awake/alert Orientation Level: Appears intact for tasks assessed Behavior During Session: [facility] for tasks performed       End of Session PT - End of Session Equipment Utilized During Treatment: Gait belt;Oxygen Activity Tolerance: Patient tolerated treatment well Patient left: in chair;with call bell/phone within reach Nurse Communication: Mobility status     Army Chaco SPT 05/31/2012, 2:03 PM

## 2012-05-31 NOTE — Progress Notes (Signed)
Seen and agree with SPT note Kylieann Eagles Tabor Delbert Vu, PT [phone]  

## 2012-05-31 NOTE — Progress Notes (Signed)
Condom cath change.

## 2012-05-31 NOTE — Progress Notes (Signed)
Pt d/c To [facility]. D/c instructions and medications reviewed with Pt. Pt states understanding. All Pt questions answered.  Report given to RN at [facility]

## 2012-05-31 NOTE — Progress Notes (Signed)
Thank you to Dr Geoffry Paradise for this referral.  Patient is not currently eligible for services because his PCP is not [facility] affiliated.  Patient evaluated for community based chronic disease management services with [facility] Care Management Program as a benefit of patient's AARP Medicare Complete Insurance.  Spoke with patient and his daughter at bedside to explain [facility] Care Management services and confirm his PCP.  Left contact information and [facility] literature at bedside.  His current PCP is Dr Carollee Sires in [place]. He plans to change his primary MD services to Dr Oneta Rack after discharge to home from SNF.  May patient aware that when he changes PCP he may be eligible for services in the future.  Of note, [facility] Care Management services does not replace or interfere with any services that are arranged by inpatient case management or social work.  For additional questions or referrals please contact Anibal Henderson BSN RN [facility] Liaison at [phone].

## 2012-06-01 ENCOUNTER — Emergency Department (HOSPITAL_COMMUNITY): Payer: Medicare Other

## 2012-06-01 ENCOUNTER — Encounter (HOSPITAL_COMMUNITY): Payer: Self-pay | Admitting: Emergency Medicine

## 2012-06-01 ENCOUNTER — Inpatient Hospital Stay (HOSPITAL_COMMUNITY)
Admission: EM | Admit: 2012-06-01 | Discharge: 2012-06-04 | DRG: 603 | Disposition: A | Discharge status: Skilled Nursing Facility | Payer: Medicare Other | Attending: Internal Medicine | Admitting: Internal Medicine

## 2012-06-01 DIAGNOSIS — M79606 Pain in leg, unspecified: Secondary | ICD-10-CM

## 2012-06-01 DIAGNOSIS — L03119 Cellulitis of unspecified part of limb: Secondary | ICD-10-CM

## 2012-06-01 DIAGNOSIS — N179 Acute kidney failure, unspecified: Secondary | ICD-10-CM

## 2012-06-01 DIAGNOSIS — L02419 Cutaneous abscess of limb, unspecified: Principal | ICD-10-CM | POA: Diagnosis present

## 2012-06-01 DIAGNOSIS — I878 Other specified disorders of veins: Secondary | ICD-10-CM

## 2012-06-01 DIAGNOSIS — A419 Sepsis, unspecified organism: Secondary | ICD-10-CM

## 2012-06-01 DIAGNOSIS — J449 Chronic obstructive pulmonary disease, unspecified: Secondary | ICD-10-CM | POA: Diagnosis present

## 2012-06-01 DIAGNOSIS — I872 Venous insufficiency (chronic) (peripheral): Secondary | ICD-10-CM | POA: Diagnosis present

## 2012-06-01 DIAGNOSIS — R238 Other skin changes: Secondary | ICD-10-CM

## 2012-06-01 DIAGNOSIS — J9601 Acute respiratory failure with hypoxia: Secondary | ICD-10-CM

## 2012-06-01 DIAGNOSIS — Z87891 Personal history of nicotine dependence: Secondary | ICD-10-CM

## 2012-06-01 DIAGNOSIS — Z66 Do not resuscitate: Secondary | ICD-10-CM | POA: Diagnosis present

## 2012-06-01 DIAGNOSIS — I10 Essential (primary) hypertension: Secondary | ICD-10-CM

## 2012-06-01 DIAGNOSIS — J189 Pneumonia, unspecified organism: Secondary | ICD-10-CM

## 2012-06-01 DIAGNOSIS — I442 Atrioventricular block, complete: Secondary | ICD-10-CM

## 2012-06-01 DIAGNOSIS — J4489 Other specified chronic obstructive pulmonary disease: Secondary | ICD-10-CM | POA: Diagnosis present

## 2012-06-01 DIAGNOSIS — G8929 Other chronic pain: Secondary | ICD-10-CM

## 2012-06-01 DIAGNOSIS — D72829 Elevated white blood cell count, unspecified: Secondary | ICD-10-CM

## 2012-06-01 DIAGNOSIS — I440 Atrioventricular block, first degree: Secondary | ICD-10-CM

## 2012-06-01 DIAGNOSIS — L0291 Cutaneous abscess, unspecified: Secondary | ICD-10-CM

## 2012-06-01 DIAGNOSIS — L0390 Cellulitis, unspecified: Secondary | ICD-10-CM

## 2012-06-01 DIAGNOSIS — E1165 Type 2 diabetes mellitus with hyperglycemia: Secondary | ICD-10-CM | POA: Diagnosis present

## 2012-06-01 DIAGNOSIS — IMO0002 Reserved for concepts with insufficient information to code with codable children: Secondary | ICD-10-CM

## 2012-06-01 DIAGNOSIS — IMO0001 Reserved for inherently not codable concepts without codable children: Secondary | ICD-10-CM | POA: Diagnosis present

## 2012-06-01 LAB — CBC
HCT: 36.6 % — ABNORMAL LOW (ref 39.0–52.0)
MCHC: 32.5 g/dL (ref 30.0–36.0)
MCV: 92.2 fL (ref 78.0–100.0)
RDW: 14.2 % (ref 11.5–15.5)
WBC: 12.7 10*3/uL — ABNORMAL HIGH (ref 4.0–10.5)

## 2012-06-01 LAB — BASIC METABOLIC PANEL
BUN: 32 mg/dL — ABNORMAL HIGH (ref 6–23)
Chloride: 99 mEq/L (ref 96–112)
Creatinine, Ser: 1.27 mg/dL (ref 0.50–1.35)
GFR calc Af Amer: 60 mL/min — ABNORMAL LOW (ref >90)

## 2012-06-01 MED ORDER — PIPERACILLIN-TAZOBACTAM 3.375 G IVPB
3.3750 g | Freq: Three times a day (TID) | INTRAVENOUS | Status: DC
  Administered 2012-06-02 – 2012-06-03 (×5): 3.375 g via INTRAVENOUS
  Filled 2012-06-01 (×6): qty 50

## 2012-06-01 MED ORDER — PIPERACILLIN-TAZOBACTAM 3.375 G IVPB: 3.3750 g | Freq: Four times a day (QID) | INTRAVENOUS | Status: DC

## 2012-06-01 MED ORDER — ATORVASTATIN CALCIUM 40 MG PO TABS
40.0000 mg | ORAL_TABLET | Freq: Every day | ORAL | Status: DC
  Administered 2012-06-02 – 2012-06-03 (×2): 40 mg via ORAL
  Filled 2012-06-01 (×3): qty 1

## 2012-06-01 MED ORDER — VANCOMYCIN HCL 1000 MG IV SOLR
2000.0000 mg | Freq: Once | INTRAVENOUS | Status: AC
  Administered 2012-06-01: 2000 mg via INTRAVENOUS
  Filled 2012-06-01: qty 2000

## 2012-06-01 MED ORDER — SODIUM CHLORIDE 0.9 % IJ SOLN
3.0000 mL | Freq: Two times a day (BID) | INTRAMUSCULAR | Status: DC
  Administered 2012-06-02 – 2012-06-03 (×3): 3 mL via INTRAVENOUS

## 2012-06-01 MED ORDER — INSULIN ASPART 100 UNIT/ML ~~LOC~~ SOLN
0.0000 [IU] | Freq: Three times a day (TID) | SUBCUTANEOUS | Status: DC
  Administered 2012-06-02: 2 [IU] via SUBCUTANEOUS
  Administered 2012-06-02: 3 [IU] via SUBCUTANEOUS
  Administered 2012-06-03 – 2012-06-04 (×4): 2 [IU] via SUBCUTANEOUS

## 2012-06-01 MED ORDER — AMLODIPINE BESYLATE 10 MG PO TABS
10.0000 mg | ORAL_TABLET | Freq: Every day | ORAL | Status: DC
Start: 2012-06-02 — End: 2012-06-04
  Administered 2012-06-02 – 2012-06-04 (×3): 10 mg via ORAL
  Filled 2012-06-01 (×3): qty 1

## 2012-06-01 MED ORDER — POTASSIUM CHLORIDE CRYS ER 10 MEQ PO TBCR: 10.0000 meq | EXTENDED_RELEASE_TABLET | Freq: Two times a day (BID) | ORAL | Status: DC

## 2012-06-01 MED ORDER — ZOLPIDEM TARTRATE 5 MG PO TABS: 5.0000 mg | ORAL_TABLET | Freq: Every evening | ORAL | Status: DC | PRN

## 2012-06-01 MED ORDER — SODIUM CHLORIDE 0.9 % IV SOLN
INTRAVENOUS | Status: DC
  Administered 2012-06-02: 04:00:00 via INTRAVENOUS

## 2012-06-01 MED ORDER — PIPERACILLIN-TAZOBACTAM 3.375 G IVPB: 3.3750 g | Freq: Once | INTRAVENOUS | Status: DC

## 2012-06-01 MED ORDER — HEPARIN SODIUM (PORCINE) 5000 UNIT/ML IJ SOLN
5000.0000 [IU] | Freq: Three times a day (TID) | INTRAMUSCULAR | Status: DC
  Filled 2012-06-01: qty 1

## 2012-06-01 MED ORDER — OXYCODONE-ACETAMINOPHEN 10-325 MG PO TABS: 1.0000 | ORAL_TABLET | ORAL | Status: DC | PRN

## 2012-06-01 MED ORDER — HYDRALAZINE HCL 25 MG PO TABS
25.0000 mg | ORAL_TABLET | Freq: Three times a day (TID) | ORAL | Status: DC
  Administered 2012-06-02: 25 mg via ORAL
  Filled 2012-06-01 (×4): qty 1

## 2012-06-01 MED ORDER — VITAMIN E 180 MG (400 UNIT) PO CAPS
400.0000 [IU] | ORAL_CAPSULE | Freq: Every day | ORAL | Status: DC
  Administered 2012-06-02 – 2012-06-04 (×3): 400 [IU] via ORAL
  Filled 2012-06-01 (×3): qty 1

## 2012-06-01 MED ORDER — ALBUTEROL SULFATE HFA 108 (90 BASE) MCG/ACT IN AERS: 4.0000 | INHALATION_SPRAY | RESPIRATORY_TRACT | Status: DC | PRN

## 2012-06-01 NOTE — Progress Notes (Signed)
Late Entry: Clinical social worker assisted with patient discharge to skilled nursing facility,[facility].  CSW addressed all family questions and concerns. CSW copied chart and added all important documents. CSW also set up patient transportation with [employer]. Clinical Social Worker will sign off for now as social work intervention is no longer needed.   Sabino Niemann, MSW, [employer] [phone]

## 2012-06-01 NOTE — Progress Notes (Signed)
ANTIBIOTIC CONSULT NOTE - INITIAL  Pharmacy Consult for vancomycin  Indication: Possible cellulitis  Allergies  Allergen Reactions  . Alka-Seltzer Heartburn (Sodium Bicarbonate-Citric Acid)   . Citric Acid     All the ingredients in alka seltzer gold  . Potassium Bicarbonate     All the ingredients in alka seltzer gold  . Sodium Bicarbonate     All the ingredients in alka seltzer gold    Patient Measurements: Height: 5' 8.9" (175 cm) Weight: 312 lb 2.7 oz (141.599 kg) (Per 05/31/12 documentation) IBW/kg (Calculated) : 70.47   Vital Signs: Temp: 98.6 F (37 C) (11/27 1824) Temp src: Oral (11/27 1824) BP: 141/59 mmHg (11/27 2315) Pulse Rate: 87  (11/27 2315) Intake/Output from previous day:   Intake/Output from this shift:    Labs:  [facility] 06/01/12 2118 05/31/12 0605 05/30/12 0600  WBC 12.7* 13.4* 16.3*  HGB 11.9* 12.2* 12.6*  PLT 381 318 349  LABCREA -- -- --  CREATININE 1.27 1.10 1.05   Estimated Creatinine Clearance: 66 ml/min (by C-G formula based on Cr of 1.27). No results found for this basename: VANCOTROUGH:2,VANCOPEAK:2,VANCORANDOM:2,GENTTROUGH:2,GENTPEAK:2,GENTRANDOM:2,TOBRATROUGH:2,TOBRAPEAK:2,TOBRARND:2,AMIKACINPEAK:2,AMIKACINTROU:2,AMIKACIN:2, in the last 72 hours   Microbiology: Recent Results (from the past 720 hour(s))  CULTURE, BLOOD (ROUTINE X 2)     Status: Normal   Collection Time   05/22/12  8:33 PM      Component Value Range Status Comment   Specimen Description BLOOD RIGHT HAND   Final    Special Requests BOTTLES DRAWN AEROBIC AND ANAEROBIC 3CC   Final    Culture  Setup Time 05/23/2012 08:55   Final    Culture NO GROWTH 5 DAYS   Final    Report Status 05/29/2012 FINAL   Final   CULTURE, BLOOD (ROUTINE X 2)     Status: Normal   Collection Time   05/22/12  8:44 PM      Component Value Range Status Comment   Specimen Description BLOOD RIGHT HAND   Final    Special Requests BOTTLES DRAWN AEROBIC AND ANAEROBIC 5CC   Final    Culture  Setup  Time 05/23/2012 08:54   Final    Culture NO GROWTH 5 DAYS   Final    Report Status 05/29/2012 FINAL   Final   URINE CULTURE     Status: Normal   Collection Time   05/22/12  9:05 PM      Component Value Range Status Comment   Specimen Description URINE, CATHETERIZED   Final    Special Requests NONE   Final    Culture  Setup Time 05/23/2012 09:51   Final    Colony Count 35,000 COLONIES/ML   Final    Culture ENTEROBACTER CLOACAE   Final    Report Status 05/25/2012 FINAL   Final    Organism ID, Bacteria ENTEROBACTER CLOACAE   Final   MRSA PCR SCREENING     Status: Normal   Collection Time   05/23/12  2:31 AM      Component Value Range Status Comment   MRSA by PCR NEGATIVE  NEGATIVE Final   URINE CULTURE     Status: Normal   Collection Time   05/23/12  5:11 PM      Component Value Range Status Comment   Specimen Description URINE, CATHETERIZED   Final    Special Requests NONE   Final    Culture  Setup Time 05/24/2012 02:44   Final    Colony Count NO GROWTH   Final  Culture NO GROWTH   Final    Report Status 05/24/2012 FINAL   Final     Medical History: Past Medical History  Diagnosis Date  . Diabetes mellitus without complication   . COPD (chronic obstructive pulmonary disease)   . Hypertension     Medications:  Scheduled:    . amLODipine  10 mg Oral Daily  . atorvastatin  40 mg Oral Daily  . heparin  5,000 Units Subcutaneous Q8H  . hydrALAZINE  25 mg Oral Q8H  . insulin aspart  0-15 Units Subcutaneous TID WC  . potassium chloride  10 mEq Oral BID  . sodium chloride  3 mL Intravenous Q12H  . vitamin E  400 Units Oral Daily   Assessment: 78 yo male who was discharged on 11/26 is now re-admitted with possible cellulitis. Pharmacy consulted to manage vancomycin. Patient has already received 2gm vancomycin dose. Patient is also to receive Zosyn.   Goal of Therapy:  Vancomycin trough level 10-15 mcg/ml  Plan:  1. Vancomycin 1.5 gm IV Q12H.  Emeline Gins 06/01/2012,11:45 PM

## 2012-06-01 NOTE — ED Provider Notes (Signed)
History     CSN: [id number]  Arrival date & time 06/01/12  1818   First MD Initiated Contact with Patient 06/01/12 1820      Chief Complaint  Patient presents with  . Cellulitis    (Consider location/radiation/quality/duration/timing/severity/associated sxs/prior treatment) Patient is a 78 y.o. male presenting with rash. The history is provided by the patient.  Rash  This is a new problem. The current episode started more than 1 week ago. The problem has been rapidly worsening. Associated with: previous small scratch that was cellulitis previously. There has been no fever. The rash is present on the left lower leg. The pain is at a severity of 7/10. The pain is severe. The pain has been worsening since onset. Associated symptoms include blisters, pain and weeping. He has tried nothing for the symptoms.    Past Medical History  Diagnosis Date  . Diabetes mellitus without complication   . COPD (chronic obstructive pulmonary disease)   . Hypertension     History reviewed. No pertinent past surgical history.  No family history on file.  History  Substance Use Topics  . Smoking status: Former Smoker -- 1.0 packs/day for 20 years    Types: Cigarettes    Quit date: 05/24/1969  . Smokeless tobacco: Never Used  . Alcohol Use:       Review of Systems  Constitutional: Negative for fever and chills.  Respiratory: Negative for cough and shortness of breath.   Gastrointestinal: Negative for abdominal pain and diarrhea.  Skin: Positive for rash.  All other systems reviewed and are negative.    Allergies  Alka-seltzer heartburn; Citric acid; Potassium bicarbonate; and Sodium bicarbonate  Home Medications   Current Outpatient Rx  Name  Route  Sig  Dispense  Refill  . ALBUTEROL SULFATE HFA 108 (90 BASE) MCG/ACT IN AERS   Inhalation   Inhale 4 puffs into the lungs every 2 (two) hours as needed for wheezing.         Marland Kitchen AMLODIPINE BESYLATE 10 MG PO TABS   Oral   Take 1  tablet (10 mg total) by mouth daily.   30 tablet   0   . ATORVASTATIN CALCIUM 40 MG PO TABS   Oral   Take 40 mg by mouth daily.         . CEPHALEXIN 500 MG PO CAPS   Oral   Take 1 capsule (500 mg total) by mouth 4 (four) times daily. For 5 days   20 capsule   0   . INVANZ IJ   Intramuscular   Inject 1 g into the muscle daily. For 10 days starting 06/01/12         . GLIPIZIDE ER 10 MG PO TB24   Oral   Take 10 mg by mouth 2 (two) times daily.         Marland Kitchen HYDRALAZINE HCL 25 MG PO TABS   Oral   Take 1 tablet (25 mg total) by mouth every 8 (eight) hours.   90 tablet   0   . METFORMIN HCL 1000 MG PO TABS   Oral   Take 1,000 mg by mouth 2 (two) times daily with a meal.         . ADULT MULTIVITAMIN W/MINERALS CH   Oral   Take 1 tablet by mouth daily.         . OXYCODONE-ACETAMINOPHEN 10-325 MG PO TABS   Oral   Take 1 tablet by mouth every 4 (four) hours as  needed. pain         . OXYCODONE-ACETAMINOPHEN 5-325 MG PO TABS   Oral   Take 1-2 tablets by mouth every 4 (four) hours as needed.   30 tablet   0   . POTASSIUM CHLORIDE CRYS ER 10 MEQ PO TBCR   Oral   Take 10 mEq by mouth 2 (two) times daily.         Marland Kitchen RIFAMPIN 300 MG PO CAPS   Oral   Take 300 mg by mouth 2 (two) times daily. For 10 days starting 06/01/12         . VITAMIN E 400 UNITS PO CAPS   Oral   Take 400 Units by mouth daily.         Marland Kitchen ZOLPIDEM TARTRATE 5 MG PO TABS   Oral   Take 1 tablet (5 mg total) by mouth at bedtime as needed for sleep.   30 tablet   0     BP 133/56  Pulse 91  Temp 98.6 F (37 C) (Oral)  Resp 18  SpO2 96%  Physical Exam  Nursing note and vitals reviewed. Constitutional: He is oriented to person, place, and time. He appears well-developed and well-nourished. No distress.  HENT:  Head: Normocephalic and atraumatic.  Mouth/Throat: No oropharyngeal exudate.  Eyes: EOM are normal. Pupils are equal, round, and reactive to light.  Neck: Normal range of  motion. Neck supple.  Cardiovascular: Normal rate and regular rhythm.  Exam reveals no friction rub.   No murmur heard. Pulmonary/Chest: Effort normal and breath sounds normal. No respiratory distress. He has no wheezes. He has no rales.  Abdominal: He exhibits no distension. There is no tenderness. There is no rebound.  Musculoskeletal: Normal range of motion. He exhibits no edema.       Right lower leg: He exhibits swelling (moderate, severe) and edema (moderate-severe).       Circumferential erythema with blistering/weeping and desquamation. Much tenderness to palpation. Erythema extends proximal up to groin with associated induration.   Neurological: He is alert and oriented to person, place, and time.  Skin: He is not diaphoretic.    ED Course  Procedures (including critical care time)  Labs Reviewed  CBC - Abnormal; Notable for the following:    WBC 12.7 (*)     RBC 3.97 (*)     Hemoglobin 11.9 (*)     HCT 36.6 (*)     All other components within normal limits  BASIC METABOLIC PANEL - Abnormal; Notable for the following:    Glucose, Bld 117 (*)     BUN 32 (*)     GFR calc non Af Amer 52 (*)     GFR calc Af Amer 60 (*)     All other components within normal limits  SEDIMENTATION RATE - Abnormal; Notable for the following:    Sed Rate 90 (*)     All other components within normal limits  CULTURE, BLOOD (ROUTINE X 2)  CULTURE, BLOOD (ROUTINE X 2)   Dg Tibia/fibula Left  06/01/2012  *RADIOLOGY REPORT*  Clinical Data: Pain and swelling.  LEFT TIBIA AND FIBULA - 2 VIEW  Comparison: None  Findings: There are moderate the joint degenerative changes and mild to moderate ankle joint degenerative changes.  No acute fracture of the tibia or fibula.  IMPRESSION: Degenerative joint disease but no acute bony findings.   Original Report Authenticated By: Rudie Meyer, M.D.      1. Cellulitis   2.  Leg pain       MDM   Patient is a 78 year old male with recent history of  hospitalization for 30 heart block. He was discharged yesterday. He returns today for worsening cellulitis of his left leg. The cellulitis initiated while in the hospital he was given IV antibiotic for at that time. He was discharged with Keflex for it.  He presents today with worsening of the cellulitis. Daughter is at the bedside stating that it has spread proximally and distally circumferentially. He also has some spread and induration medially and posteriorly  with with erythema. Patient's vitals are stable. He reports the chest or abdomen pain. He is not having trouble breathing.  Patient's left calf has a raging cellulitis which is circumferential and with the quick spread and lymphatic spread up the leg there is concern for necrotizing fasciitis. He is diabetic. Labs and x-ray were ordered. Orthopedics consulted to evaluate patient. Vancomycin and Zosyn ordered for the patient and asked to be given after cultures were drawn, however the lab had difficulty obtaining blood and no cultures were drawn. Antibiotics were given prior to cultures being obtained Orthopedic surgeon saw the patient states this is not necrotizing fasciitis. He does recommend a special boot which he asked the Ortho tech to place on the patient. He will follow patient as an inpatient.  Labs show leukocytosis is improving, however his clinical symptoms are worsening. Medicine consult and will and the patient.  Elwin Mocha, MD 06/01/12 [id number]

## 2012-06-01 NOTE — H&P (Signed)
[facility] History and Physical  Ballard Budney GNF:[id number] DOB: 05/21/1931 DOA: 06/01/2012  Referring physician: ED PCP: Nonnie Done., MD  Specialists: None  Chief Complaint: Cellulitis  HPI: Luis Carrillo is a 78 y.o. male who was just discharged yesterday from the hospital for sepsis, and cellulitis which had significantly improved during that stay (see D/C summary from yesterday for details).  Unfortunately since leaving, the patient's cellulitis located on his LLE has significantly worsened with obvious lymphangitis up his leg and edema.  He has not been having fever/chills nor systemic symptoms.  He was to be on just ancef while at the rehab.  In the ED Orthopaedic surgery evaluated the patient and saw no evidence of necrotizing facitis.  WBC was 12 which is actually down from his hospital stay, Hospitalist has been asked to admit for his cellulitis.  Review of Systems: 12 systems reviewed and negative.  Past Medical History  Diagnosis Date  . Diabetes mellitus without complication   . COPD (chronic obstructive pulmonary disease)   . Hypertension    History reviewed. No pertinent past surgical history. Social History:  reports that he quit smoking about 43 years ago. His smoking use included Cigarettes. He has a 20 pack-year smoking history. He has never used smokeless tobacco. His alcohol and drug histories not on file. Normally independent and living at home.  Allergies  Allergen Reactions  . Alka-Seltzer Heartburn (Sodium Bicarbonate-Citric Acid)   . Citric Acid     All the ingredients in alka seltzer gold  . Potassium Bicarbonate     All the ingredients in alka seltzer gold  . Sodium Bicarbonate     All the ingredients in alka seltzer gold    No family history on file. daughter is alive and well  Prior to Admission medications   Medication Sig Start Date End Date Taking? Authorizing Provider  albuterol (PROVENTIL HFA;VENTOLIN HFA) 108 (90 BASE) MCG/ACT  inhaler Inhale 4 puffs into the lungs every 2 (two) hours as needed for wheezing. 05/31/12  Yes Zannie Cove, MD  amLODipine (NORVASC) 10 MG tablet Take 1 tablet (10 mg total) by mouth daily. 05/31/12  Yes Zannie Cove, MD  atorvastatin (LIPITOR) 40 MG tablet Take 40 mg by mouth daily.   Yes Historical Provider, MD  cephALEXin (KEFLEX) 500 MG capsule Take 1 capsule (500 mg total) by mouth 4 (four) times daily. For 5 days 05/31/12  Yes Zannie Cove, MD  Ertapenem Sodium (INVANZ IJ) Inject 1 g into the muscle daily. For 10 days starting 06/01/12   Yes Historical Provider, MD  glipiZIDE (GLUCOTROL XL) 10 MG 24 hr tablet Take 10 mg by mouth 2 (two) times daily.   Yes Historical Provider, MD  hydrALAZINE (APRESOLINE) 25 MG tablet Take 1 tablet (25 mg total) by mouth every 8 (eight) hours. 05/31/12  Yes Zannie Cove, MD  metFORMIN (GLUCOPHAGE) 1000 MG tablet Take 1,000 mg by mouth 2 (two) times daily with a meal.   Yes Historical Provider, MD  Multiple Vitamin (MULTIVITAMIN WITH MINERALS) TABS Take 1 tablet by mouth daily.   Yes Historical Provider, MD  oxyCODONE-acetaminophen (PERCOCET) 10-325 MG per tablet Take 1 tablet by mouth every 4 (four) hours as needed. pain   Yes Historical Provider, MD  oxyCODONE-acetaminophen (PERCOCET/ROXICET) 5-325 MG per tablet Take 1-2 tablets by mouth every 4 (four) hours as needed. 05/31/12  Yes Zannie Cove, MD  potassium chloride (K-DUR,KLOR-CON) 10 MEQ tablet Take 10 mEq by mouth 2 (two) times daily.   Yes Historical Provider,  MD  rifampin (RIFADIN) 300 MG capsule Take 300 mg by mouth 2 (two) times daily. For 10 days starting 06/01/12   Yes Historical Provider, MD  vitamin E 400 UNIT capsule Take 400 Units by mouth daily.   Yes Historical Provider, MD  zolpidem (AMBIEN) 5 MG tablet Take 1 tablet (5 mg total) by mouth at bedtime as needed for sleep. 05/31/12  Yes Zannie Cove, MD   Physical Exam: Filed Vitals:   06/01/12 1818 06/01/12 1824 06/01/12 2203  06/01/12 2315  BP:  133/56 145/88 141/59  Pulse:  91 91 87  Temp:  98.6 F (37 C)    TempSrc:  Oral    Resp:  18 16   SpO2: 97% 96% 97% 98%    General:  NAD, resting comfortably in bed Eyes: PEERLA EOMI ENT: mucous membranes moist Neck: supple w/o JVD Cardiovascular: RRR w/o MRG Respiratory: CTA B Abdomen: soft, nt, nd, bs+ Skin: no rash nor lesion Musculoskeletal: MAE, full ROM all 4 extremities Psychiatric: normal tone and affect Neurologic: AAOx3, grossly non-focal  Labs on Admission:  Basic Metabolic Panel:  Lab 06/01/12 1610 05/31/12 0605 05/30/12 0600 05/29/12 0700 05/28/12 0540 05/26/12 0430  NA 136 138 140 139 141 --  K 3.8 3.7 3.5 3.5 3.3* --  CL 99 101 100 100 102 --  CO2 25 25 27 25 28  --  GLUCOSE 117* 124* 167* 149* 141* --  BUN 32* 24* 22 22 27* --  CREATININE 1.27 1.10 1.05 0.97 1.03 --  CALCIUM 9.2 9.2 9.0 8.9 8.8 --  MG -- -- -- -- -- 2.0  PHOS -- -- -- -- -- 2.5   Liver Function Tests:  Lab 05/26/12 0430  AST 63*  ALT 78*  ALKPHOS 82  BILITOT 0.4  PROT 7.2  ALBUMIN 2.3*   No results found for this basename: LIPASE:5,AMYLASE:5 in the last 168 hours No results found for this basename: AMMONIA:5 in the last 168 hours CBC:  Lab 06/01/12 2118 05/31/12 0605 05/30/12 0600 05/29/12 0700 05/28/12 0540  WBC 12.7* 13.4* 16.3* 19.4* 20.7*  NEUTROABS -- -- -- -- --  HGB 11.9* 12.2* 12.6* 11.8* 11.8*  HCT 36.6* 36.5* 37.9* 36.0* 36.0*  MCV 92.2 92.4 92.9 92.1 94.0  PLT 381 318 349 325 334   Cardiac Enzymes: No results found for this basename: CKTOTAL:5,CKMB:5,CKMBINDEX:5,TROPONINI:5 in the last 168 hours  BNP (last 3 results) No results found for this basename: PROBNP:3 in the last 8760 hours CBG:  Lab 05/31/12 1125 05/31/12 0619 05/30/12 2111 05/30/12 1624 05/30/12 1118  GLUCAP 114* 111* 152* 168* 168*    Radiological Exams on Admission: Dg Tibia/fibula Left  06/01/2012  *RADIOLOGY REPORT*  Clinical Data: Pain and swelling.  LEFT TIBIA AND  FIBULA - 2 VIEW  Comparison: None  Findings: There are moderate the joint degenerative changes and mild to moderate ankle joint degenerative changes.  No acute fracture of the tibia or fibula.  IMPRESSION: Degenerative joint disease but no acute bony findings.   Original Report Authenticated By: Rudie Meyer, M.D.     EKG: Independently reviewed.  Assessment/Plan Active Problems:  Lower extremity cellulitis  Lower extremity venous stasis (left)  Diabetes mellitus type 2, uncontrolled  Brief CHB (complete heart block)   1. LLE cellulitis - starting emperic treatment with zosyn and vanc, appears to have failed just ancef.  No evidence of systemic involvement at this point and unlikely to be nec-fash 2. DM2 - will hold home meds and put on SSI insulin  while here to try and get better control of this which may be contributing to worsening cellulitis 3. Brief episode of CHB during last stay - will put patient on tele monitor, no CHB at this point in time did not get pacer placed last admit.    Code Status: Full Code (must indicate code status--if unknown or must be presumed, indicate so) Family Communication: spoke with daughter at bedside (indicate person spoken with, if applicable, with phone number if by telephone) Disposition Plan: Admit to inpatient (indicate anticipated LOS)  Time spent: 35  GARDNER, JARED M. [facility] Pager [phone]  If 7PM-7AM, please contact night-coverage [email or web address] Password [email or web address] 06/01/2012, 11:37 PM

## 2012-06-01 NOTE — Progress Notes (Signed)
Orthopedic Tech Progress Note Patient Details:  Luis Carrillo April 05, 1931 [id number]  Ortho Devices Type of Ortho Device: Radio broadcast assistant Ortho Device/Splint Location: left leg Ortho Device/Splint Interventions: Application   Nikki Dom 06/01/2012, 9:35 PM

## 2012-06-01 NOTE — Consult Note (Signed)
Reason for Consult: Cellulitis left leg Referring Physician: Rancour  Luis Carrillo is an 78 y.o. male.  HPI: Patient is a 78 year old gentleman diabetic COPD peripheral vascular disease who recently was hospitalized for about 9 days. Patient did have cellulitis with venous stasis edema in the left lower extremity turned admission he was treated with IV antibiotics and the cellulitis resolved significantly white cell count decreased from approximately 30,000-13,000. Patient went to skilled nursing and in less than 24 hours developed recurrent cellulitis he presents at this time with recurrent cellulitis with blistering skin on the left lower extremity.  Past Medical History  Diagnosis Date  . Diabetes mellitus without complication   . COPD (chronic obstructive pulmonary disease)   . Hypertension     History reviewed. No pertinent past surgical history.  No family history on file.  Social History:  reports that he quit smoking about 43 years ago. His smoking use included Cigarettes. He has a 20 pack-year smoking history. He has never used smokeless tobacco. His alcohol and drug histories not on file.  Allergies:  Allergies  Allergen Reactions  . Alka-Seltzer Heartburn (Sodium Bicarbonate-Citric Acid)   . Citric Acid     All the ingredients in alka seltzer gold  . Potassium Bicarbonate     All the ingredients in alka seltzer gold  . Sodium Bicarbonate     All the ingredients in alka seltzer gold    Medications: I have reviewed the patient's current medications.  Results for orders placed during the hospital encounter of 05/22/12 (from the past 48 hour(s))  GLUCOSE, CAPILLARY     Status: Abnormal   Collection Time   05/30/12  9:11 PM      Component Value Range Comment   Glucose-Capillary 152 (*) 70 - 99 mg/dL    Comment 1 Notify RN      Comment 2 Documented in Chart     CBC     Status: Abnormal   Collection Time   05/31/12  6:05 AM      Component Value Range Comment   WBC 13.4  (*) 4.0 - 10.5 K/uL    RBC 3.95 (*) 4.22 - 5.81 MIL/uL    Hemoglobin 12.2 (*) 13.0 - 17.0 g/dL    HCT 56.2 (*) 13.0 - 52.0 %    MCV 92.4  78.0 - 100.0 fL    MCH 30.9  26.0 - 34.0 pg    MCHC 33.4  30.0 - 36.0 g/dL    RDW 86.5  78.4 - 69.6 %    Platelets 318  150 - 400 K/uL   BASIC METABOLIC PANEL     Status: Abnormal   Collection Time   05/31/12  6:05 AM      Component Value Range Comment   Sodium 138  135 - 145 mEq/L    Potassium 3.7  3.5 - 5.1 mEq/L    Chloride 101  96 - 112 mEq/L    CO2 25  19 - 32 mEq/L    Glucose, Bld 124 (*) 70 - 99 mg/dL    BUN 24 (*) 6 - 23 mg/dL    Creatinine, Ser 2.95  0.50 - 1.35 mg/dL    Calcium 9.2  8.4 - 28.4 mg/dL    GFR calc non Af Amer 62 (*) >90 mL/min    GFR calc Af Amer 72 (*) >90 mL/min   GLUCOSE, CAPILLARY     Status: Abnormal   Collection Time   05/31/12  6:19 AM  Component Value Range Comment   Glucose-Capillary 111 (*) 70 - 99 mg/dL   GLUCOSE, CAPILLARY     Status: Abnormal   Collection Time   05/31/12 11:25 AM      Component Value Range Comment   Glucose-Capillary 114 (*) 70 - 99 mg/dL    Comment 1 Notify RN       No results found.  Review of Systems  All other systems reviewed and are negative.   Blood pressure 133/56, pulse 91, temperature 98.6 F (37 C), temperature source Oral, resp. rate 18, SpO2 96.00%. Physical Exam On examination patient has a palpable dorsalis pedis pulse bilaterally. A thrill is palpable consistent with calcification of the dorsalis pedis vessel. Patient has clear weeping edema from the left lower extremity he is cellulitis up to the tibial tubercle and does have some cellulitis in the groin region on the left leg. The calf is soft the toes and ankles move well no signs of DVT no crepitation with palpation no evidence or signs of necrotizing fasciitis. Assessment/Plan: Assessment: Venous stasis insufficiency left lower extremity with cellulitis and weeping edema.  Plan: Patient will be started on  appropriate of vancomycin and Zosyn. I will start him on an Unna compressive wraps at this time he will keep his leg elevated above his heart. Proper diet and nutrition was discussed. Once the swelling begins to resolve we will place him in a silver alpacas sock, the  risks and benefits of the sock were discussed with the patient and his daughter and they wish to proceed. I will followup during his hospitalization and at discharge.  [name] 06/01/2012, 8:34 PM

## 2012-06-01 NOTE — ED Notes (Addendum)
Pt brought to ED via [facility] from [facility] complaining of worsening of cellulitis.  Pt was discharged from hospital yesterday after a 9 day stay for treatment of cellulitis, was on IV antibiotics while in hospital, was sent home with PO Keflex.  LLE erythema and swelling has worsened since yesterday.  Pt denies any pain at this time, NAD noted.

## 2012-06-02 ENCOUNTER — Observation Stay (HOSPITAL_COMMUNITY): Payer: Medicare Other

## 2012-06-02 DIAGNOSIS — I442 Atrioventricular block, complete: Secondary | ICD-10-CM

## 2012-06-02 DIAGNOSIS — J9600 Acute respiratory failure, unspecified whether with hypoxia or hypercapnia: Secondary | ICD-10-CM

## 2012-06-02 LAB — CBC
HCT: 35.2 % — ABNORMAL LOW (ref 39.0–52.0)
Hemoglobin: 11.4 g/dL — ABNORMAL LOW (ref 13.0–17.0)
MCHC: 32.4 g/dL (ref 30.0–36.0)

## 2012-06-02 LAB — BASIC METABOLIC PANEL
BUN: 29 mg/dL — ABNORMAL HIGH (ref 6–23)
Chloride: 102 mEq/L (ref 96–112)
GFR calc Af Amer: 64 mL/min — ABNORMAL LOW (ref >90)
Glucose, Bld: 132 mg/dL — ABNORMAL HIGH (ref 70–99)
Potassium: 3.9 mEq/L (ref 3.5–5.1)

## 2012-06-02 LAB — HEMOGLOBIN A1C: Mean Plasma Glucose: 169 mg/dL — ABNORMAL HIGH (ref <117)

## 2012-06-02 LAB — GLUCOSE, CAPILLARY
Glucose-Capillary: 152 mg/dL — ABNORMAL HIGH (ref 70–99)
Glucose-Capillary: 109 mg/dL — ABNORMAL HIGH (ref 70–99)

## 2012-06-02 MED ORDER — HEPARIN SODIUM (PORCINE) 5000 UNIT/ML IJ SOLN
5000.0000 [IU] | Freq: Three times a day (TID) | INTRAMUSCULAR | Status: DC
  Administered 2012-06-02 – 2012-06-04 (×7): 5000 [IU] via SUBCUTANEOUS
  Filled 2012-06-02 (×10): qty 1

## 2012-06-02 MED ORDER — OXYCODONE-ACETAMINOPHEN 5-325 MG PO TABS
1.0000 | ORAL_TABLET | ORAL | Status: DC | PRN
  Administered 2012-06-02 – 2012-06-03 (×4): 1 via ORAL
  Filled 2012-06-02 (×5): qty 1

## 2012-06-02 MED ORDER — POTASSIUM CHLORIDE CRYS ER 10 MEQ PO TBCR
10.0000 meq | EXTENDED_RELEASE_TABLET | Freq: Two times a day (BID) | ORAL | Status: DC
  Administered 2012-06-02 – 2012-06-04 (×5): 10 meq via ORAL
  Filled 2012-06-02 (×6): qty 1

## 2012-06-02 MED ORDER — SODIUM CHLORIDE 0.9 % IV SOLN: INTRAVENOUS | Status: AC

## 2012-06-02 MED ORDER — OXYCODONE HCL 5 MG PO TABS
5.0000 mg | ORAL_TABLET | ORAL | Status: DC | PRN
  Administered 2012-06-02 (×2): 5 mg via ORAL
  Filled 2012-06-02 (×2): qty 1

## 2012-06-02 MED ORDER — HYDRALAZINE HCL 50 MG PO TABS
50.0000 mg | ORAL_TABLET | Freq: Four times a day (QID) | ORAL | Status: DC
  Administered 2012-06-02 – 2012-06-04 (×8): 50 mg via ORAL
  Filled 2012-06-02 (×15): qty 1

## 2012-06-02 MED ORDER — VANCOMYCIN HCL 1000 MG IV SOLR
1500.0000 mg | Freq: Two times a day (BID) | INTRAVENOUS | Status: DC
  Administered 2012-06-02 – 2012-06-04 (×5): 1500 mg via INTRAVENOUS
  Filled 2012-06-02 (×6): qty 1500

## 2012-06-02 MED ORDER — MORPHINE SULFATE 2 MG/ML IJ SOLN
2.0000 mg | INTRAMUSCULAR | Status: DC | PRN
  Administered 2012-06-02 – 2012-06-04 (×8): 2 mg via INTRAVENOUS
  Filled 2012-06-02 (×8): qty 1

## 2012-06-02 NOTE — Progress Notes (Signed)
Left leg dressing removed per Dr. Thedore Mins. Left open to air, with pillows underneath to elevate the leg. Leg is erythematous, with no obvious spots of drainage. Will continue to monitor.

## 2012-06-02 NOTE — Progress Notes (Signed)
[facility]                                                                                Patient Demographics  Luis Carrillo, is a 78 y.o. male  WUJ:[id number]  NFA:[id number]  DOB - 1931/04/30  Admit date - 06/01/2012  Admitting Physician Hillary Bow, DO  Outpatient Primary MD for the patient is Nonnie Done., MD  LOS - 1   Chief Complaint  Patient presents with  . Cellulitis        Assessment & Plan    1. Lower extremity cellulitis,  Lower extremity venous stasis (left) - failed outpatient treatment, shows clinical improvement with IV Vanco and Zosyn, patient was seen by Dr. Lajoyce Corners orthopedics in the ER, he recommended Orthotec to place but to the left leg which will be done, we'll keep left leg elevated, follow blood culture results and ultrasound results which are pending.   . 2. Diabetes mellitus type 2, uncontrolled -  Continue sliding scale insulin for now, sugars appear to be stable.  Lab Results  Component Value Date   HGBA1C 6.7* 05/23/2012    CBG (last 3)   Basename 06/02/12 0751 06/02/12 0031 05/31/12 1125  GLUCAP 135* 136* 114*      3. Brief episode of complete heart block last admission- avoid AV node blocking agents including beta blocker and calcium channel blocker along with clonidine. Telemetry monitor    4. History of dyslipidemia and hypertension continue Lipitor, hydralazine and Norvasc. Will increase hydralazine dose for better control     Code Status: DNR  Family Communication: PATIENT  Disposition Plan: HOME    Procedures leg [place]   Consults  are due to over the phone   Time Spent in minutes  35   Antibiotics    Anti-infectives     Start     Dose/Rate Route Frequency Ordered Stop   06/02/12 0000   piperacillin-tazobactam (ZOSYN) IVPB 3.375 g  Status:  Discontinued        3.375 g 12.5 mL/hr over 240 Minutes Intravenous 4 times per day 06/01/12 2337 06/01/12 2348   06/02/12 0000   piperacillin-tazobactam (ZOSYN) IVPB 3.375 g       3.375 g 12.5 mL/hr over 240 Minutes Intravenous Every 8 hours 06/01/12 2348     06/01/12 1945   vancomycin (VANCOCIN) 2,000 mg in sodium chloride 0.9 % 500 mL IVPB        2,000 mg 250 mL/hr over 120 Minutes Intravenous  Once 06/01/12 1930 06/01/12 2214   06/01/12 1945   piperacillin-tazobactam (ZOSYN) IVPB 3.375 g  Status:  Discontinued        3.375 g 12.5 mL/hr over 240 Minutes Intravenous  Once 06/01/12 1930 06/01/12 2337          Scheduled Meds:   . amLODipine  10 mg Oral Daily  . atorvastatin  40 mg Oral q1800  . heparin  5,000 Units Subcutaneous Q8H  . hydrALAZINE  25 mg Oral Q8H  . insulin aspart  0-15 Units Subcutaneous TID WC  . piperacillin-tazobactam (ZOSYN)  IV  3.375 g Intravenous Q8H  . potassium chloride  10 mEq Oral BID  . sodium  chloride  3 mL Intravenous Q12H  . [COMPLETED] vancomycin  2,000 mg Intravenous Once  . vitamin E  400 Units Oral Daily  . [DISCONTINUED] heparin  5,000 Units Subcutaneous Q8H  . [DISCONTINUED] piperacillin-tazobactam (ZOSYN)  IV  3.375 g Intravenous Once  . [DISCONTINUED] piperacillin-tazobactam (ZOSYN)  IV  3.375 g Intravenous Q6H  . [DISCONTINUED] potassium chloride  10 mEq Oral BID   Continuous Infusions:   . sodium chloride    . [DISCONTINUED] sodium chloride 75 mL/hr at 06/02/12 0337   PRN Meds:.albuterol, oxyCODONE-acetaminophen, zolpidem, [DISCONTINUED] oxyCODONE, [DISCONTINUED] oxyCODONE-acetaminophen   DVT Prophylaxis   Heparin    Lab Results  Component Value Date   PLT 344 06/02/2012      Leroy Sea M.D on 06/02/2012 at 8:55 AM  Between 7am to 7pm - Pager - [phone]  After 7pm go to [email or web address] - password [email or web address]  And look for the night coverage person covering for me after hours  [facility] Office  [phone]    Subjective:   Bharat Antillon today has, No headache, No chest pain, No abdominal pain - No Nausea, No new weakness  tingling or numbness, No Cough - SOB.   Objective:   Filed Vitals:   06/01/12 2203 06/01/12 2315 06/02/12 0025 06/02/12 0541  BP: 145/88 141/59 155/86 152/101  Pulse: 91 87 91 86  Temp:   97.4 F (36.3 C) 97.3 F (36.3 C)  TempSrc:   Oral Oral  Resp: 16  17 18   Height:  5' 8.9" (1.75 m) 5\' 9"  (1.753 m)   Weight:  141.599 kg (312 lb 2.7 oz) 141.341 kg (311 lb 9.6 oz)   SpO2: 97% 98% 93% 92%    Wt Readings from Last 3 Encounters:  06/02/12 141.341 kg (311 lb 9.6 oz)  05/31/12 141.6 kg (312 lb 2.7 oz)     Intake/Output Summary (Last 24 hours) at 06/02/12 0855 Last data filed at 06/02/12 0700  Gross per 24 hour  Intake 493.75 ml  Output      0 ml  Net 493.75 ml    Exam Awake Alert, Oriented X 3, No new F.N deficits, Normal affect [place].AT,PERRAL Supple Neck,No JVD, No cervical lymphadenopathy appriciated.  Symmetrical Chest wall movement, Good air movement bilaterally, CTAB RRR,No Gallops,Rubs or new Murmurs, No Parasternal Heave +ve B.Sounds, Abd Soft, Non tender, No organomegaly appriciated, No rebound - guarding or rigidity. No Cyanosis, Clubbing or edema, No new Rash or bruise, leg is swollen red and warm up to the left knee patient is wearing Ace wrap bandage   Data Review   Micro Results Recent Results (from the past 240 hour(s))  URINE CULTURE     Status: Normal   Collection Time   05/23/12  5:11 PM      Component Value Range Status Comment   Specimen Description URINE, CATHETERIZED   Final    Special Requests NONE   Final    Culture  Setup Time 05/24/2012 02:44   Final    Colony Count NO GROWTH   Final    Culture NO GROWTH   Final    Report Status 05/24/2012 FINAL   Final     Radiology Reports Dg Tibia/fibula Left  06/01/2012  *RADIOLOGY REPORT*  Clinical Data: Pain and swelling.  LEFT TIBIA AND FIBULA - 2 VIEW  Comparison: None  Findings: There are moderate the joint degenerative changes and mild to moderate ankle joint degenerative changes.  No acute  fracture of the tibia or fibula.  IMPRESSION:  Degenerative joint disease but no acute bony findings.   Original Report Authenticated By: Rudie Meyer, M.D.       CBC  Lab 06/02/12 0522 06/01/12 2118 05/31/12 0605 05/30/12 0600 05/29/12 0700  WBC 11.3* 12.7* 13.4* 16.3* 19.4*  HGB 11.4* 11.9* 12.2* 12.6* 11.8*  HCT 35.2* 36.6* 36.5* 37.9* 36.0*  PLT 344 381 318 349 325  MCV 94.1 92.2 92.4 92.9 92.1  MCH 30.5 30.0 30.9 30.9 30.2  MCHC 32.4 32.5 33.4 33.2 32.8  RDW 14.3 14.2 14.2 14.1 14.1  LYMPHSABS -- -- -- -- --  MONOABS -- -- -- -- --  EOSABS -- -- -- -- --  BASOSABS -- -- -- -- --  BANDABS -- -- -- -- --    Chemistries   Lab 06/02/12 0522 06/01/12 2118 05/31/12 0605 05/30/12 0600 05/29/12 0700  NA 138 136 138 140 139  K 3.9 3.8 3.7 3.5 3.5  CL 102 99 101 100 100  CO2 25 25 25 27 25   GLUCOSE 132* 117* 124* 167* 149*  BUN 29* 32* 24* 22 22  CREATININE 1.21 1.27 1.10 1.05 0.97  CALCIUM 8.9 9.2 9.2 9.0 8.9  MG -- -- -- -- --  AST -- -- -- -- --  ALT -- -- -- -- --  ALKPHOS -- -- -- -- --  BILITOT -- -- -- -- --   ------------------------------------------------------------------------------------------------------------------ estimated creatinine clearance is 69.2 ml/min (by C-G formula based on Cr of 1.21). ------------------------------------------------------------------------------------------------------------------ No results found for this basename: HGBA1C:2 in the last 72 hours ------------------------------------------------------------------------------------------------------------------ No results found for this basename: CHOL:2,HDL:2,LDLCALC:2,TRIG:2,CHOLHDL:2,LDLDIRECT:2 in the last 72 hours ------------------------------------------------------------------------------------------------------------------ No results found for this basename: TSH,T4TOTAL,FREET3,T3FREE,THYROIDAB in the last 72  hours ------------------------------------------------------------------------------------------------------------------ No results found for this basename: VITAMINB12:2,FOLATE:2,FERRITIN:2,TIBC:2,IRON:2,RETICCTPCT:2 in the last 72 hours  Coagulation profile No results found for this basename: INR:5,PROTIME:5 in the last 168 hours  No results found for this basename: DDIMER:2 in the last 72 hours  Cardiac Enzymes No results found for this basename: CK:3,CKMB:3,TROPONINI:3,MYOGLOBIN:3 in the last 168 hours ------------------------------------------------------------------------------------------------------------------ No components found with this basename: POCBNP:3

## 2012-06-02 NOTE — Progress Notes (Signed)
Late entry:  Pt admitted to unit [facility]es for [facility]. Pt is AOX4 ambulatory with assistance and uses walker . Pt  Placed on tele. Pt has cellulitis to left lower leg with occlusive compression wrap over area that was place onby doctor in ED this admission. Pt has healed abrasions/scabs to upper lip.  No other skin issues noted. Pt and family oriented to unit, staff, safety and policies. Pt told to use call bell light to have nurse assist with ambulation for increased safety. Bed alarm on.

## 2012-06-02 NOTE — Progress Notes (Signed)
Unna boot was reapplied by ortho techs, however pt cannot receive the [place] to his L leg with the unna boot in place. Unna boot must be removed prior to [place]. Since ortho tech just reapplied the unna boot, plan to leave unna boot in place tonight, remove tomorrow immediately before [place] leg, and have unna boot reapplied after [place]. MD made aware.

## 2012-06-02 NOTE — ED Provider Notes (Signed)
I saw and evaluated the patient, reviewed the resident's note and I agree with the findings and plan.  Recent admit for sepsis and complete heart block.  Worsening LLE cellulitis since discharge yesterday.  Circumferential, edematous, weeping erythema with desquamation.  Able to wiggle toes, calf soft. No crepitance.  Lymphangitis extending to proximal thigh.  Glynn Octave, MD 06/02/12 1141

## 2012-06-03 DIAGNOSIS — I440 Atrioventricular block, first degree: Secondary | ICD-10-CM

## 2012-06-03 DIAGNOSIS — N179 Acute kidney failure, unspecified: Secondary | ICD-10-CM

## 2012-06-03 LAB — GLUCOSE, CAPILLARY
Glucose-Capillary: 139 mg/dL — ABNORMAL HIGH (ref 70–99)
Glucose-Capillary: 132 mg/dL — ABNORMAL HIGH (ref 70–99)

## 2012-06-03 MED ORDER — SODIUM CHLORIDE 0.9 % IV SOLN
1.0000 g | INTRAVENOUS | Status: DC
  Administered 2012-06-03 – 2012-06-04 (×2): 1 g via INTRAVENOUS
  Filled 2012-06-03 (×2): qty 1

## 2012-06-03 MED ORDER — OXYCODONE-ACETAMINOPHEN 5-325 MG PO TABS
1.0000 | ORAL_TABLET | ORAL | Status: DC | PRN
  Administered 2012-06-03: 1 via ORAL
  Administered 2012-06-03: 2 via ORAL
  Administered 2012-06-03: 1 via ORAL
  Administered 2012-06-04 (×3): 2 via ORAL
  Filled 2012-06-03 (×3): qty 2
  Filled 2012-06-03: qty 1
  Filled 2012-06-03: qty 2
  Filled 2012-06-03: qty 1

## 2012-06-03 MED ORDER — SODIUM CHLORIDE 0.9 % IJ SOLN
10.0000 mL | INTRAMUSCULAR | Status: DC | PRN
  Administered 2012-06-04: 10 mL

## 2012-06-03 NOTE — Progress Notes (Signed)
Patient ID: Luis Carrillo, male   DOB: [birth year], 78 y.o.   MRN: [id number] Cellulitis and swelling are improving on the left lower extremity. Patient is currently on vancomycin and Zosyn.  The Unna boot was removed. The calf is soft no signs of abscess no signs of compartment syndrome no sign of DVT.  Photographs were obtained his calf largest circumference was 44 cm. A silver alpacas sock was applied by layer. This should provide 30-40 compression. Size 16 sock applied.  We will change sock as needed and will change out to a size 13 socket next sock changes.

## 2012-06-03 NOTE — Progress Notes (Signed)
Peripherally Inserted Central Catheter/Midline Placement  The IV Nurse has discussed with the patient and/or persons authorized to consent for the patient, the purpose of this procedure and the potential benefits and risks involved with this procedure.  The benefits include less needle sticks, lab draws from the catheter and patient may be discharged home with the catheter.  Risks include, but not limited to, infection, bleeding, blood clot (thrombus formation), and puncture of an artery; nerve damage and irregular heat beat.  Alternatives to this procedure were also discussed.  PICC/Midline Placement Documentation        Luis Carrillo 06/03/2012, 12:46 PM

## 2012-06-03 NOTE — Progress Notes (Signed)
Verified patient is a full code per Dr. Thedore Mins.  Previous note read DNR.  Current order reads full code.

## 2012-06-03 NOTE — Progress Notes (Addendum)
[facility]                                                                                Patient Demographics  Luis Carrillo, is a 78 y.o. male  ZOX:[id number]  WJX:[id number]  DOB - 03/30/1931  Admit date - 06/01/2012  Admitting Physician Hillary Bow, DO  Outpatient Primary MD for the patient is Nonnie Done., MD  LOS - 2   Chief Complaint  Patient presents with  . Cellulitis        Assessment & Plan    1. Lower extremity cellulitis,  Lower extremity venous stasis (left) - failed outpatient treatment, shows clinical improvement with IV Vanco and Zosyn we'll switch Zosyn to Invanz on 06/03/2012 for ease of dosing at home or at SNF, patient was seen by Dr. Lajoyce Corners bedside with me on 06/03/2012, clinically he agrees patient has improved, medicated specialized socks applied by Dr. Lajoyce Corners personally. Of note he was admitted for 90s to this hospital for IV antibiotics, he failed oral antibiotic treatment within a few days of discharge, place PICC line for prolonged IV antibiotics.  . 2. Diabetes mellitus type 2, uncontrolled -  Continue sliding scale insulin for now, sugars appear to be stable.  Lab Results  Component Value Date   HGBA1C 7.5* 06/02/2012    CBG (last 3)   Basename 06/03/12 0758 06/02/12 2143 06/02/12 1625  GLUCAP 142* 139* 109*      3. Brief episode of complete heart block last admission- avoid AV node blocking agents including beta blocker and calcium channel blocker along with clonidine. Telemetry monitor     4. History of dyslipidemia and hypertension continue Lipitor, hydralazine and Norvasc. Will increase hydralazine dose for better control     Code Status: DNR  Family Communication: PATIENT  Disposition Plan: HOME    Procedures - PICC line placement requested 06/03/2012   Consults  are due to over the phone   Time Spent in minutes  35   Antibiotics    Anti-infectives     Start     Dose/Rate Route Frequency  Ordered Stop   06/02/12 1000   vancomycin (VANCOCIN) 1,500 mg in sodium chloride 0.9 % 500 mL IVPB        1,500 mg 250 mL/hr over 120 Minutes Intravenous Every 12 hours 06/02/12 0917     06/02/12 0000   piperacillin-tazobactam (ZOSYN) IVPB 3.375 g  Status:  Discontinued        3.375 g 12.5 mL/hr over 240 Minutes Intravenous 4 times per day 06/01/12 2337 06/01/12 2348   06/02/12 0000  piperacillin-tazobactam (ZOSYN) IVPB 3.375 g       3.375 g 12.5 mL/hr over 240 Minutes Intravenous Every 8 hours 06/01/12 2348     06/01/12 1945   vancomycin (VANCOCIN) 2,000 mg in sodium chloride 0.9 % 500 mL IVPB        2,000 mg 250 mL/hr over 120 Minutes Intravenous  Once 06/01/12 1930 06/01/12 2214   06/01/12 1945   piperacillin-tazobactam (ZOSYN) IVPB 3.375 g  Status:  Discontinued        3.375 g 12.5 mL/hr over 240 Minutes Intravenous  Once 06/01/12 1930 06/01/12 2337  Scheduled Meds:    . amLODipine  10 mg Oral Daily  . atorvastatin  40 mg Oral q1800  . heparin  5,000 Units Subcutaneous Q8H  . hydrALAZINE  50 mg Oral Q6H  . insulin aspart  0-15 Units Subcutaneous TID WC  . piperacillin-tazobactam (ZOSYN)  IV  3.375 g Intravenous Q8H  . potassium chloride  10 mEq Oral BID  . sodium chloride  3 mL Intravenous Q12H  . vancomycin  1,500 mg Intravenous Q12H  . vitamin E  400 Units Oral Daily   Continuous Infusions:    . [EXPIRED] sodium chloride 50 mL/hr at 06/02/12 0900   PRN Meds:.albuterol, morphine injection, oxyCODONE-acetaminophen, zolpidem, [DISCONTINUED] oxyCODONE-acetaminophen   DVT Prophylaxis   Heparin    Lab Results  Component Value Date   PLT 344 06/02/2012      Leroy Sea M.D on 06/03/2012 at 10:34 AM  Between 7am to 7pm - Pager - [phone]  After 7pm go to [email or web address] - password [email or web address]  And look for the night coverage person covering for me after hours  [facility] Office  [phone]    Subjective:   Luis Carrillo today  has, No headache, No chest pain, No abdominal pain - No Nausea, No new weakness tingling or numbness, No Cough - SOB.   Objective:   Filed Vitals:   06/02/12 2157 06/03/12 0530 06/03/12 0533 06/03/12 0750  BP: 149/54 89/59 135/52 169/60  Pulse: 83 76 76 81  Temp: 97.8 F (36.6 C) 97.8 F (36.6 C)  97.4 F (36.3 C)  TempSrc: Oral Oral  Oral  Resp: 17 16 16 16   Height:      Weight: 142.566 kg (314 lb 4.8 oz)     SpO2: 97% 99% 98% 98%    Wt Readings from Last 3 Encounters:  06/02/12 142.566 kg (314 lb 4.8 oz)  05/31/12 141.6 kg (312 lb 2.7 oz)     Intake/Output Summary (Last 24 hours) at 06/03/12 1034 Last data filed at 06/02/12 1700  Gross per 24 hour  Intake   1310 ml  Output      0 ml  Net   1310 ml    Exam Awake Alert, Oriented X 3, No new F.N deficits, Normal affect [place].AT,PERRAL Supple Neck,No JVD, No cervical lymphadenopathy appriciated.  Symmetrical Chest wall movement, Good air movement bilaterally, CTAB RRR,No Gallops,Rubs or new Murmurs, No Parasternal Heave +ve B.Sounds, Abd Soft, Non tender, No organomegaly appriciated, No rebound - guarding or rigidity. No Cyanosis, Clubbing or edema, No new Rash or bruise, leg is swollen red and warm up margins are receding, medicated socks applied by Dr. Lajoyce Corners bedside on 06/03/2012   Data Review   Micro Results Recent Results (from the past 240 hour(s))  CULTURE, BLOOD (ROUTINE X 2)     Status: Normal (Preliminary result)   Collection Time   06/01/12  9:25 PM      Component Value Range Status Comment   Specimen Description BLOOD RIGHT ARM   Final    Special Requests BOTTLES DRAWN AEROBIC AND ANAEROBIC 10CC   Final    Culture  Setup Time 06/02/2012 09:06   Final    Culture     Final    Value:        BLOOD CULTURE RECEIVED NO GROWTH TO DATE CULTURE WILL BE HELD FOR 5 DAYS BEFORE ISSUING A FINAL NEGATIVE REPORT   Report Status PENDING   Incomplete   CULTURE, BLOOD (ROUTINE X 2)     Status:  Normal (Preliminary result)     Collection Time   06/01/12  9:30 PM      Component Value Range Status Comment   Specimen Description BLOOD RIGHT ARM   Final    Special Requests BOTTLES DRAWN AEROBIC ONLY [facility]   Final    Culture  Setup Time 06/02/2012 09:06   Final    Culture     Final    Value:        BLOOD CULTURE RECEIVED NO GROWTH TO DATE CULTURE WILL BE HELD FOR 5 DAYS BEFORE ISSUING A FINAL NEGATIVE REPORT   Report Status PENDING   Incomplete      Radiology Reports Dg Tibia/fibula Left  06/01/2012  *RADIOLOGY REPORT*  Clinical Data: Pain and swelling.  LEFT TIBIA AND FIBULA - 2 VIEW  Comparison: None  Findings: There are moderate the joint degenerative changes and mild to moderate ankle joint degenerative changes.  No acute fracture of the tibia or fibula.  IMPRESSION: Degenerative joint disease but no acute bony findings.   Original Report Authenticated By: Rudie Meyer, M.D.       CBC  Lab 06/02/12 0522 06/01/12 2118 05/31/12 0605 05/30/12 0600 05/29/12 0700  WBC 11.3* 12.7* 13.4* 16.3* 19.4*  HGB 11.4* 11.9* 12.2* 12.6* 11.8*  HCT 35.2* 36.6* 36.5* 37.9* 36.0*  PLT 344 381 318 349 325  MCV 94.1 92.2 92.4 92.9 92.1  MCH 30.5 30.0 30.9 30.9 30.2  MCHC 32.4 32.5 33.4 33.2 32.8  RDW 14.3 14.2 14.2 14.1 14.1  LYMPHSABS -- -- -- -- --  MONOABS -- -- -- -- --  EOSABS -- -- -- -- --  BASOSABS -- -- -- -- --  BANDABS -- -- -- -- --     Chemistries   Lab 06/02/12 0522 06/01/12 2118 05/31/12 0605 05/30/12 0600 05/29/12 0700  NA 138 136 138 140 139  K 3.9 3.8 3.7 3.5 3.5  CL 102 99 101 100 100  CO2 25 25 25 27 25   GLUCOSE 132* 117* 124* 167* 149*  BUN 29* 32* 24* 22 22  CREATININE 1.21 1.27 1.10 1.05 0.97  CALCIUM 8.9 9.2 9.2 9.0 8.9  MG -- -- -- -- --  AST -- -- -- -- --  ALT -- -- -- -- --  ALKPHOS -- -- -- -- --  BILITOT -- -- -- -- --   ------------------------------------------------------------------------------------------------------------------ estimated creatinine clearance is 69.7  ml/min (by C-G formula based on Cr of 1.21). ------------------------------------------------------------------------------------------------------------------  [facility] 06/02/12 0522  HGBA1C 7.5*   ------------------------------------------------------------------------------------------------------------------ No results found for this basename: CHOL:2,HDL:2,LDLCALC:2,TRIG:2,CHOLHDL:2,LDLDIRECT:2 in the last 72 hours ------------------------------------------------------------------------------------------------------------------ No results found for this basename: TSH,T4TOTAL,FREET3,T3FREE,THYROIDAB in the last 72 hours ------------------------------------------------------------------------------------------------------------------ No results found for this basename: VITAMINB12:2,FOLATE:2,FERRITIN:2,TIBC:2,IRON:2,RETICCTPCT:2 in the last 72 hours  Coagulation profile No results found for this basename: INR:5,PROTIME:5 in the last 168 hours  No results found for this basename: DDIMER:2 in the last 72 hours  Cardiac Enzymes No results found for this basename: CK:3,CKMB:3,TROPONINI:3,MYOGLOBIN:3 in the last 168 hours ------------------------------------------------------------------------------------------------------------------ No components found with this basename: POCBNP:3

## 2012-06-03 NOTE — Progress Notes (Signed)
PT Cancellation Note  Patient Details Name: Luis Carrillo MRN: [id number] DOB: 20-Jun-1931   Cancelled Treatment:    Reason Eval/Treat Not Completed: Pain limiting ability to participate   [facility] 06/03/2012, 3:42 PM

## 2012-06-04 DIAGNOSIS — I10 Essential (primary) hypertension: Secondary | ICD-10-CM

## 2012-06-04 LAB — BASIC METABOLIC PANEL
BUN: 16 mg/dL (ref 6–23)
Calcium: 8.5 mg/dL (ref 8.4–10.5)
Creatinine, Ser: 1.06 mg/dL (ref 0.50–1.35)
GFR calc Af Amer: 75 mL/min — ABNORMAL LOW (ref >90)
GFR calc non Af Amer: 65 mL/min — ABNORMAL LOW (ref >90)
Potassium: 3.9 mEq/L (ref 3.5–5.1)

## 2012-06-04 LAB — GLUCOSE, CAPILLARY
Glucose-Capillary: 136 mg/dL — ABNORMAL HIGH (ref 70–99)
Glucose-Capillary: 109 mg/dL — ABNORMAL HIGH (ref 70–99)

## 2012-06-04 MED ORDER — SODIUM CHLORIDE 0.9 % IV SOLN: 1.0000 g | INTRAVENOUS | Status: AC

## 2012-06-04 MED ORDER — ZOLPIDEM TARTRATE 5 MG PO TABS: 5.0000 mg | ORAL_TABLET | Freq: Every evening | ORAL | Status: DC | PRN

## 2012-06-04 MED ORDER — OXYCODONE-ACETAMINOPHEN 10-325 MG PO TABS: 2.0000 | ORAL_TABLET | ORAL | Status: DC | PRN

## 2012-06-04 MED ORDER — HEPARIN SOD (PORK) LOCK FLUSH 100 UNIT/ML IV SOLN: 250.0000 [IU] | INTRAVENOUS | Status: DC | PRN

## 2012-06-04 MED ORDER — VANCOMYCIN HCL 1000 MG IV SOLR: 1500.0000 mg | Freq: Two times a day (BID) | INTRAVENOUS | Status: AC

## 2012-06-04 MED ORDER — GI COCKTAIL ~~LOC~~
30.0000 mL | Freq: Once | ORAL | Status: AC
  Administered 2012-06-04: 30 mL via ORAL
  Filled 2012-06-04: qty 30

## 2012-06-04 NOTE — Progress Notes (Signed)
Pt discharged to SNF with PICC line in place. Pt's leg wrapped with orthopedic sock. Assessment unchanged from morning. Discharge packet sent with EMS.

## 2012-06-04 NOTE — Progress Notes (Signed)
Critical Vanc trough reported to me by lab: 28.0. Vanc infusion had already been started by prior nurse, so I stopped the infusion at 12:50. MD notified.

## 2012-06-04 NOTE — Progress Notes (Signed)
[facility]                                                                                Patient Demographics  Luis Carrillo, is a 78 y.o. male  RUE:[id number]  JYN:[id number]  DOB - April 13, 1931  Admit date - 06/01/2012  Admitting Physician Hillary Bow, DO  Outpatient Primary MD for the patient is Nonnie Done., MD  LOS - 3   Chief Complaint  Patient presents with  . Cellulitis        Assessment & Plan    1. Lower extremity cellulitis,  Lower extremity venous stasis (left) - failed outpatient treatment, shows clinical improvement with IV Vanco and Zosyn we'll switch Zosyn to Invanz on 06/03/2012 for ease of dosing at home or at SNF, patient was seen by Dr. Lajoyce Corners bedside with me on 06/03/2012, clinically he agrees patient has improved, medicated specialized socks applied by Dr. Lajoyce Corners personally. Of note he was admitted  for IV antibiotics, he failed oral antibiotic treatment within a few days of discharge, place PICC line for prolonged IV antibiotics.  . 2. Diabetes mellitus type : CBGs stable. Continue current regimen.  Lab Results  Component Value Date   HGBA1C 7.5* 06/02/2012    CBG (last 3)   Basename 06/04/12 0754 06/04/12 0118 06/03/12 2205  GLUCAP 124* 108* 127*      3. Brief episode of complete heart block last admission- avoid AV node blocking agents including beta blocker and calcium channel blocker along with clonidine. Telemetry monitor. No events noted on tele as of yet.     4. History of dyslipidemia and hypertension continue Lipitor, hydralazine and Norvasc. Will increase hydralazine dose for better control     Code Status: DNR  Family Communication: PATIENT  Disposition Plan: HOME    Consults  are due to over the phone   Time Spent in minutes  35   Antibiotics    Anti-infectives     Start     Dose/Rate Route Frequency Ordered Stop   06/03/12 1100   ertapenem (INVANZ) 1 g in sodium chloride 0.9 % 50 mL IVPB         1 g 100 mL/hr over 30 Minutes Intravenous Every 24 hours 06/03/12 1037     06/02/12 1000   vancomycin (VANCOCIN) 1,500 mg in sodium chloride 0.9 % 500 mL IVPB        1,500 mg 250 mL/hr over 120 Minutes Intravenous Every 12 hours 06/02/12 0917     06/02/12 0000   piperacillin-tazobactam (ZOSYN) IVPB 3.375 g  Status:  Discontinued        3.375 g 12.5 mL/hr over 240 Minutes Intravenous 4 times per day 06/01/12 2337 06/01/12 2348   06/02/12 0000   piperacillin-tazobactam (ZOSYN) IVPB 3.375 g  Status:  Discontinued        3.375 g 12.5 mL/hr over 240 Minutes Intravenous Every 8 hours 06/01/12 2348 06/03/12 1037   06/01/12 1945   vancomycin (VANCOCIN) 2,000 mg in sodium chloride 0.9 % 500 mL IVPB        2,000 mg 250 mL/hr over 120 Minutes Intravenous  Once 06/01/12 1930 06/01/12 2214   06/01/12 1945  piperacillin-tazobactam (ZOSYN) IVPB 3.375 g  Status:  Discontinued        3.375 g 12.5 mL/hr over 240 Minutes Intravenous  Once 06/01/12 1930 06/01/12 2337          Scheduled Meds:    . amLODipine  10 mg Oral Daily  . atorvastatin  40 mg Oral q1800  . ertapenem  1 g Intravenous Q24H  . heparin  5,000 Units Subcutaneous Q8H  . hydrALAZINE  50 mg Oral Q6H  . insulin aspart  0-15 Units Subcutaneous TID WC  . potassium chloride  10 mEq Oral BID  . sodium chloride  3 mL Intravenous Q12H  . vancomycin  1,500 mg Intravenous Q12H  . vitamin E  400 Units Oral Daily  . [DISCONTINUED] piperacillin-tazobactam (ZOSYN)  IV  3.375 g Intravenous Q8H   Continuous Infusions:   PRN Meds:.albuterol, morphine injection, oxyCODONE-acetaminophen, sodium chloride, zolpidem   DVT Prophylaxis   Heparin    Lab Results  Component Value Date   PLT 344 06/02/2012      Chaya Jan M.D on 06/04/2012 at 9:22 AM  Between 7am to 7pm - Pager - [phone]  After 7pm go to [email or web address] - password [email or web address]  And look for the night coverage person covering for me after hours  [facility] Office  [phone]    Subjective:   Luis Carrillo today has, No headache, No chest pain, No abdominal pain - No Nausea, No new weakness tingling or numbness, No Cough - SOB.   Objective:   Filed Vitals:   06/03/12 1808 06/03/12 2207 06/04/12 0442 06/04/12 0850  BP: 161/75 139/59 145/54   Pulse: 89 77 81   Temp: 97.2 F (36.2 C) 98.3 F (36.8 C) 97.5 F (36.4 C)   TempSrc: Oral Oral Oral   Resp: 18 18 18    Height:      Weight:  144.153 kg (317 lb 12.8 oz)    SpO2: 99% 98% 95% 97%    Wt Readings from Last 3 Encounters:  06/03/12 144.153 kg (317 lb 12.8 oz)  05/31/12 141.6 kg (312 lb 2.7 oz)    No intake or output data in the 24 hours ending 06/04/12 0922  Exam Awake Alert, Oriented X 3, No new F.N deficits, Normal affect [place].AT,PERRAL Supple Neck,No JVD, No cervical lymphadenopathy appriciated.  Symmetrical Chest wall movement, Good air movement bilaterally, CTAB RRR,No Gallops,Rubs or new Murmurs, No Parasternal Heave +ve B.Sounds, Abd Soft, Non tender, No organomegaly appriciated, No rebound - guarding or rigidity. No Cyanosis, Clubbing or edema, No new Rash or bruise, leg is swollen red and warm up margins are receding, medicated socks applied by Dr. Lajoyce Corners bedside on 06/03/2012   Data Review   Micro Results Recent Results (from the past 240 hour(s))  CULTURE, BLOOD (ROUTINE X 2)     Status: Normal (Preliminary result)   Collection Time   06/01/12  9:25 PM      Component Value Range Status Comment   Specimen Description BLOOD RIGHT ARM   Final    Special Requests BOTTLES DRAWN AEROBIC AND ANAEROBIC 10CC   Final    Culture  Setup Time 06/02/2012 09:06   Final    Culture     Final    Value:        BLOOD CULTURE RECEIVED NO GROWTH TO DATE CULTURE WILL BE HELD FOR 5 DAYS BEFORE ISSUING A FINAL NEGATIVE REPORT   Report Status PENDING   Incomplete   CULTURE, BLOOD (ROUTINE X 2)  Status: Normal (Preliminary result)   Collection Time   06/01/12   9:30 PM      Component Value Range Status Comment   Specimen Description BLOOD RIGHT ARM   Final    Special Requests BOTTLES DRAWN AEROBIC ONLY [facility]   Final    Culture  Setup Time 06/02/2012 09:06   Final    Culture     Final    Value:        BLOOD CULTURE RECEIVED NO GROWTH TO DATE CULTURE WILL BE HELD FOR 5 DAYS BEFORE ISSUING A FINAL NEGATIVE REPORT   Report Status PENDING   Incomplete      Radiology Reports Dg Tibia/fibula Left  06/01/2012  *RADIOLOGY REPORT*  Clinical Data: Pain and swelling.  LEFT TIBIA AND FIBULA - 2 VIEW  Comparison: None  Findings: There are moderate the joint degenerative changes and mild to moderate ankle joint degenerative changes.  No acute fracture of the tibia or fibula.  IMPRESSION: Degenerative joint disease but no acute bony findings.   Original Report Authenticated By: Rudie Meyer, M.D.       CBC  Lab 06/02/12 0522 06/01/12 2118 05/31/12 0605 05/30/12 0600 05/29/12 0700  WBC 11.3* 12.7* 13.4* 16.3* 19.4*  HGB 11.4* 11.9* 12.2* 12.6* 11.8*  HCT 35.2* 36.6* 36.5* 37.9* 36.0*  PLT 344 381 318 349 325  MCV 94.1 92.2 92.4 92.9 92.1  MCH 30.5 30.0 30.9 30.9 30.2  MCHC 32.4 32.5 33.4 33.2 32.8  RDW 14.3 14.2 14.2 14.1 14.1  LYMPHSABS -- -- -- -- --  MONOABS -- -- -- -- --  EOSABS -- -- -- -- --  BASOSABS -- -- -- -- --  BANDABS -- -- -- -- --     Chemistries   Lab 06/04/12 0445 06/02/12 0522 06/01/12 2118 05/31/12 0605 05/30/12 0600  NA 138 138 136 138 140  K 3.9 3.9 3.8 3.7 3.5  CL 105 102 99 101 100  CO2 24 25 25 25 27   GLUCOSE 126* 132* 117* 124* 167*  BUN 16 29* 32* 24* 22  CREATININE 1.06 1.21 1.27 1.10 1.05  CALCIUM 8.5 8.9 9.2 9.2 9.0  MG -- -- -- -- --  AST -- -- -- -- --  ALT -- -- -- -- --  ALKPHOS -- -- -- -- --  BILITOT -- -- -- -- --   ------------------------------------------------------------------------------------------------------------------ estimated creatinine clearance is 80 ml/min (by C-G formula based on Cr  of 1.06). ------------------------------------------------------------------------------------------------------------------  Basename 06/02/12 0522  HGBA1C 7.5*   ------------------------------------------------------------------------------------------------------------------ Peggye Pitt, MD [facility] Pager: [phone]

## 2012-06-04 NOTE — Progress Notes (Signed)
Report called to nurse at [facility].

## 2012-06-04 NOTE — Progress Notes (Signed)
ANTIBIOTIC CONSULT NOTE - FOLLOW UP  Pharmacy Consult for vancomycin Indication: cellutlis  Allergies  Allergen Reactions  . Alka-Seltzer Heartburn (Sodium Bicarbonate-Citric Acid)   . Citric Acid     All the ingredients in alka seltzer gold  . Potassium Bicarbonate     All the ingredients in alka seltzer gold  . Sodium Bicarbonate     All the ingredients in alka seltzer gold    Patient Measurements: Height: 5\' 9"  (175.3 cm) Weight: 317 lb 12.8 oz (144.153 kg) IBW/kg (Calculated) : 70.7  Adjusted Body Weight:   Vital Signs: Temp: 97.7 F (36.5 C) (11/30 1011) Temp src: Oral (11/30 1011) BP: 145/71 mmHg (11/30 1011) Pulse Rate: 85  (11/30 1011) Intake/Output from previous day: 11/29 0701 - 11/30 0700 In: 1050 [IV Piggyback:1050] Out: -  Intake/Output from this shift: Total I/O In: 550 [IV Piggyback:550] Out: -   Labs:  Basename 06/04/12 0445 06/02/12 0522 06/01/12 2118  WBC -- 11.3* 12.7*  HGB -- 11.4* 11.9*  PLT -- 344 381  LABCREA -- -- --  CREATININE 1.06 1.21 1.27   Estimated Creatinine Clearance: 80 ml/min (by C-G formula based on Cr of 1.06).  Basename 06/04/12 1200  VANCOTROUGH 28.0*  VANCOPEAK --  Drue Dun --  GENTTROUGH --  GENTPEAK --  GENTRANDOM --  TOBRATROUGH --  TOBRAPEAK --  TOBRARND --  AMIKACINPEAK --  AMIKACINTROU --  AMIKACIN --     Microbiology: Recent Results (from the past 720 hour(s))  CULTURE, BLOOD (ROUTINE X 2)     Status: Normal   Collection Time   05/22/12  8:33 PM      Component Value Range Status Comment   Specimen Description BLOOD RIGHT HAND   Final    Special Requests BOTTLES DRAWN AEROBIC AND ANAEROBIC 3CC   Final    Culture  Setup Time 05/23/2012 08:55   Final    Culture NO GROWTH 5 DAYS   Final    Report Status 05/29/2012 FINAL   Final   CULTURE, BLOOD (ROUTINE X 2)     Status: Normal   Collection Time   05/22/12  8:44 PM      Component Value Range Status Comment   Specimen Description BLOOD RIGHT HAND    Final    Special Requests BOTTLES DRAWN AEROBIC AND ANAEROBIC 5CC   Final    Culture  Setup Time 05/23/2012 08:54   Final    Culture NO GROWTH 5 DAYS   Final    Report Status 05/29/2012 FINAL   Final   URINE CULTURE     Status: Normal   Collection Time   05/22/12  9:05 PM      Component Value Range Status Comment   Specimen Description URINE, CATHETERIZED   Final    Special Requests NONE   Final    Culture  Setup Time 05/23/2012 09:51   Final    Colony Count 35,000 COLONIES/ML   Final    Culture ENTEROBACTER CLOACAE   Final    Report Status 05/25/2012 FINAL   Final    Organism ID, Bacteria ENTEROBACTER CLOACAE   Final   MRSA PCR SCREENING     Status: Normal   Collection Time   05/23/12  2:31 AM      Component Value Range Status Comment   MRSA by PCR NEGATIVE  NEGATIVE Final   URINE CULTURE     Status: Normal   Collection Time   05/23/12  5:11 PM      Component  Value Range Status Comment   Specimen Description URINE, CATHETERIZED   Final    Special Requests NONE   Final    Culture  Setup Time 05/24/2012 02:44   Final    Colony Count NO GROWTH   Final    Culture NO GROWTH   Final    Report Status 05/24/2012 FINAL   Final   CULTURE, BLOOD (ROUTINE X 2)     Status: Normal (Preliminary result)   Collection Time   06/01/12  9:25 PM      Component Value Range Status Comment   Specimen Description BLOOD RIGHT ARM   Final    Special Requests BOTTLES DRAWN AEROBIC AND ANAEROBIC 10CC   Final    Culture  Setup Time 06/02/2012 09:06   Final    Culture     Final    Value:        BLOOD CULTURE RECEIVED NO GROWTH TO DATE CULTURE WILL BE HELD FOR 5 DAYS BEFORE ISSUING A FINAL NEGATIVE REPORT   Report Status PENDING   Incomplete   CULTURE, BLOOD (ROUTINE X 2)     Status: Normal (Preliminary result)   Collection Time   06/01/12  9:30 PM      Component Value Range Status Comment   Specimen Description BLOOD RIGHT ARM   Final    Special Requests BOTTLES DRAWN AEROBIC ONLY 6CC   Final     Culture  Setup Time 06/02/2012 09:06   Final    Culture     Final    Value:        BLOOD CULTURE RECEIVED NO GROWTH TO DATE CULTURE WILL BE HELD FOR 5 DAYS BEFORE ISSUING A FINAL NEGATIVE REPORT   Report Status PENDING   Incomplete     Anti-infectives     Start     Dose/Rate Route Frequency Ordered Stop   06/04/12 0000   sodium chloride 0.9 % SOLN 50 mL with ertapenem 1 G SOLR 1 g        1 g 100 mL/hr over 30 Minutes Intravenous Every 24 hours 06/04/12 1027 06/18/12 2359   06/04/12 0000   sodium chloride 0.9 % SOLN 500 mL with vancomycin 1000 MG SOLR 1,500 mg        1,500 mg 250 mL/hr over 120 Minutes Intravenous Every 12 hours 06/04/12 1027 06/18/12 2359   06/03/12 1100   ertapenem (INVANZ) 1 g in sodium chloride 0.9 % 50 mL IVPB        1 g 100 mL/hr over 30 Minutes Intravenous Every 24 hours 06/03/12 1037     06/02/12 1000   vancomycin (VANCOCIN) 1,500 mg in sodium chloride 0.9 % 500 mL IVPB        1,500 mg 250 mL/hr over 120 Minutes Intravenous Every 12 hours 06/02/12 0917     06/02/12 0000   piperacillin-tazobactam (ZOSYN) IVPB 3.375 g  Status:  Discontinued        3.375 g 12.5 mL/hr over 240 Minutes Intravenous 4 times per day 06/01/12 2337 06/01/12 2348   06/02/12 0000   piperacillin-tazobactam (ZOSYN) IVPB 3.375 g  Status:  Discontinued        3.375 g 12.5 mL/hr over 240 Minutes Intravenous Every 8 hours 06/01/12 2348 06/03/12 1037   06/01/12 1945   vancomycin (VANCOCIN) 2,000 mg in sodium chloride 0.9 % 500 mL IVPB        2,000 mg 250 mL/hr over 120 Minutes Intravenous  Once 06/01/12 1930 06/01/12 2214  06/01/12 1945   piperacillin-tazobactam (ZOSYN) IVPB 3.375 g  Status:  Discontinued        3.375 g 12.5 mL/hr over 240 Minutes Intravenous  Once 06/01/12 1930 06/01/12 2337          Assessment: 78 yo male with cellulitis is currently on vancomycin therapy.  Vancomycin level was 28 at 1200, but level was not a trough because a dose was given at 1125 today.     Goal of Therapy:  Vancomycin trough level 10-15 mcg/ml  Plan:  1) Continue vancomycin 1500mg  iv q12h 2) Redraw vancomycin trough at 1030 tomorrow if still here.  If not, please redraw vancomycin trough tomorrow at 1030 by home health to reassess dosing.  Saddie Sandeen, [place]z-[place]in 06/04/2012,1:22 PM

## 2012-06-04 NOTE — Evaluation (Signed)
Physical Therapy Evaluation Patient Details Name: Luis Carrillo MRN: [id number] DOB: April 24, 1931 Today's Date: 06/04/2012 Time: 4098-1191 PT Time Calculation (min): 15 min  PT Assessment / Plan / Recommendation Clinical Impression  Pt adm with LLE cellulitis.  Pt was just dc'd to [facility] after hospitalization. Recommend pt return [facility] to complete rehab prior to return home.    PT Assessment  Patient needs continued PT services    Follow Up Recommendations  SNF    Does the patient have the potential to tolerate intense rehabilitation      Barriers to Discharge        Equipment Recommendations  Rolling walker with 5" wheels    Recommendations for Other Services     Frequency Min 3X/week    Precautions / Restrictions Precautions Precautions: Fall   Pertinent Vitals/Pain Lt leg 9/10. Nursing notified.      Mobility  Transfers Sit to Stand: 4: Min assist;With upper extremity assist;With armrests;From chair/3-in-1 Stand to Sit: 4: Min guard;With upper extremity assist;To toilet Details for Transfer Assistance: Pt with incr effort to rise to stand. Ambulation/Gait Ambulation/Gait Assistance: 4: Min assist Ambulation Distance (Feet): 50 Feet Assistive device: Rolling walker Ambulation/Gait Assistance Details: Verbal cues to stay closer to walker especially on turns. Gait Pattern: Step-through pattern;Decreased stride length;Trunk flexed;Wide base of support General Gait Details: DOE with short amb distance.  SaO2 97%.    Shoulder Instructions     Exercises     PT Diagnosis: Generalized weakness;Difficulty walking  PT Problem List: Decreased strength;Decreased activity tolerance;Decreased mobility;Decreased balance;Decreased knowledge of use of DME PT Treatment Interventions: DME instruction;Gait training;Functional mobility training;Balance training;Therapeutic exercise;Therapeutic activities;Patient/family education   PT Goals Acute Rehab PT Goals PT Goal  Formulation: With patient Time For Goal Achievement: 06/11/12 Potential to Achieve Goals: Good PT Goal: Supine/Side to Sit - Progress: Goal set today PT Goal: Sit to Supine/Side - Progress: Goal set today Pt will go Sit to Stand: with modified independence PT Goal: Sit to Stand - Progress: Goal set today Pt will go Stand to Sit: with modified independence PT Goal: Stand to Sit - Progress: Goal set today Pt will Ambulate: 51 - 150 feet;with modified independence;with least restrictive assistive device PT Goal: Ambulate - Progress: Goal set today  Visit Information  Last PT Received On: 06/04/12 Assistance Needed: +1    Subjective Data  Subjective: Pt states his problem is getting up. Patient Stated Goal: Return home   Prior Functioning  Home Living Lives With: Spouse Available Help at Discharge: Family;Available PRN/intermittently (supervision only) Type of Home: House Home Access: Stairs to enter [employer] of Steps: 3 Entrance Stairs-Rails: None Home Layout: One level Bathroom Shower/Tub: Advice worker with back Prior Function Level of Independence: Independent (prior to last hospitalization) Driving: Yes Vocation: Retired Musician: No difficulties    Cognition  Overall Cognitive Status: Appears within functional limits for tasks assessed/performed Arousal/Alertness: Awake/alert Orientation Level: Appears intact for tasks assessed Behavior During Session: [facility] for tasks performed Cognition - Other Comments: appears to get in a rush    Extremity/Trunk Assessment Right Lower Extremity Assessment RLE ROM/Strength/Tone: Deficits RLE ROM/Strength/Tone Deficits: grossly 4/5 Left Lower Extremity Assessment LLE ROM/Strength/Tone: Deficits LLE ROM/Strength/Tone Deficits: grossly 4/5   Balance Static Standing Balance Static Standing - Balance Support: Bilateral upper extremity  supported (on walker) Static Standing - Level of Assistance: 5: Stand by assistance  End of Session PT - End of Session Activity Tolerance: Patient limited by  fatigue Patient left: in chair;with call bell/phone within reach Nurse Communication: Mobility status (nurse tech)  GP     Skip Mayer 06/04/2012, 9:13 AM  Skip Mayer PT [phone]

## 2012-06-04 NOTE — Progress Notes (Signed)
Call placed to Dr. Lajoyce Corners per Dr Michel Santee request to inform patient is being discharge home.  Call received from on call MD informed that patient is being discharged to back to nursing home.

## 2012-06-04 NOTE — Discharge Summary (Addendum)
Physician Discharge Summary  Patient ID: Luis Carrillo MRN: [id number] DOB/AGE: 12-09-1930 78 y.o.  Admit date: 06/01/2012 Discharge date: 06/04/2012  Primary Care Physician:  Nonnie Done., MD   Discharge Diagnoses:    Active Problems:  Lower extremity cellulitis  Lower extremity venous stasis (left)  Diabetes mellitus type 2, uncontrolled  Brief CHB (complete heart block)      Medication List     As of 06/04/2012  3:54 PM    STOP taking these medications         cephALEXin 500 MG capsule   Commonly known as: KEFLEX      INVANZ IJ      oxyCODONE-acetaminophen 5-325 MG per tablet   Commonly known as: PERCOCET/ROXICET      rifampin 300 MG capsule   Commonly known as: RIFADIN      TAKE these medications         albuterol 108 (90 BASE) MCG/ACT inhaler   Commonly known as: PROVENTIL HFA;VENTOLIN HFA   Inhale 4 puffs into the lungs every 2 (two) hours as needed for wheezing.      amLODipine 10 MG tablet   Commonly known as: NORVASC   Take 1 tablet (10 mg total) by mouth daily.      atorvastatin 40 MG tablet   Commonly known as: LIPITOR   Take 40 mg by mouth daily.      glipiZIDE 10 MG 24 hr tablet   Commonly known as: GLUCOTROL XL   Take 10 mg by mouth 2 (two) times daily.      hydrALAZINE 25 MG tablet   Commonly known as: APRESOLINE   Take 1 tablet (25 mg total) by mouth every 8 (eight) hours.      metFORMIN 1000 MG tablet   Commonly known as: GLUCOPHAGE   Take 1,000 mg by mouth 2 (two) times daily with a meal.      multivitamin with minerals Tabs   Take 1 tablet by mouth daily.      oxyCODONE-acetaminophen 10-325 MG per tablet   Commonly known as: PERCOCET   Take 2 tablets by mouth every 4 (four) hours as needed for pain. pain      potassium chloride 10 MEQ tablet   Commonly known as: K-DUR,KLOR-CON   Take 10 mEq by mouth 2 (two) times daily.      sodium chloride 0.9 % SOLN 50 mL with ertapenem 1 G SOLR 1 g   Inject 1 g into the vein daily.      sodium chloride 0.9 % SOLN 500 mL with vancomycin 1000 MG SOLR 1,500 mg   Inject 1,500 mg into the vein every 12 (twelve) hours.      vitamin E 400 UNIT capsule   Take 400 Units by mouth daily.      zolpidem 5 MG tablet   Commonly known as: AMBIEN   Take 1 tablet (5 mg total) by mouth at bedtime as needed for sleep.          Disposition and Follow-up:  Will be discharged back to SNF today in stable condition. To continue IV antibiotics for his cellulitis until 06/18/12. Please make sure patient has an appointment next week to see Dr. Lajoyce Corners in his office.  Consults:  Dr. Lajoyce Corners, orthopaedics.   Significant Diagnostic Studies:  No results found.  Brief H and P: For complete details please refer to admission H and P, but in brief patient is a 78 y.o. male who was just discharged yesterday from the  hospital for sepsis, and cellulitis which had significantly improved during that stay (see D/C summary from yesterday for details). Unfortunately since leaving, the patient's cellulitis located on his LLE has significantly worsened with obvious lymphangitis up his leg and edema. He has not been having fever/chills nor systemic symptoms. He was to be on just ancef while at the rehab. We were asked to admit him for further evaluation and management.     Hospital Course:  Active Problems:  Lower extremity cellulitis  Lower extremity venous stasis (left)  Diabetes mellitus type 2, uncontrolled  Brief CHB (complete heart block)   1. Lower extremity cellulitis, Lower extremity venous stasis (left) - failed outpatient treatment, shows clinical improvement with IV Vanco and Zosyn we'll switch Zosyn to Invanz on 06/03/2012 for ease of dosing at home or at SNF, patient was seen by Dr. Lajoyce Corners bedside with me on 06/03/2012, clinically he agrees patient has improved, medicated specialized socks applied by Dr. Lajoyce Corners personally. Of note he was admitted for IV antibiotics, he failed oral antibiotic  treatment within a few days of discharge, place PICC line for prolonged IV antibiotics. Vanc and invanz until 06/18/12. Have asked Dr. Lajoyce Corners to place wound care orders in the chart.  2. DM II: Stable. Continue current regimen.    3. Brief episode of complete heart block last admission- avoid AV node blocking agents including beta blocker and calcium channel blocker along with clonidine. No events on tele this admission.  4. History of dyslipidemia and hypertension continue Lipitor, hydralazine and Norvasc.    Time spent on Discharge: Greater than 30 minutes.  SignedChaya Jan [facility] Pager: [phone] 06/04/2012, 3:54 PM

## 2012-06-04 NOTE — Clinical Social Work Psychosocial (Signed)
Clinical Social Work Department BRIEF PSYCHOSOCIAL ASSESSMENT 06/04/2012  Patient:  Luis Carrillo, Luis Carrillo     Account Number:  [id number]     Admit date:  06/01/2012  Clinical Social Worker:  Johnsie Cancel  Date/Time:  06/04/2012 01:02 PM  Referred by:  Physician  Date Referred:  06/04/2012 Referred for  SNF Placement   Other Referral:   Interview type:  Family Other interview type:    PSYCHOSOCIAL DATA Living Status:  FACILITY Admitted from facility:  [facility] [place] Level of care:  Skilled Nursing Facility Primary support name:  Andrey Campanile [phone]) Primary support relationship to patient:  CHILD, ADULT Degree of support available:   Adequate, vested. At patient's bedside.    CURRENT CONCERNS Current Concerns  Post-Acute Placement   Other Concerns:    SOCIAL WORK ASSESSMENT / PLAN CSW consulted by MD re: d/c back to SNF. CSW contacted [facility] who expressed frustration sending patient back to SNF. CSW contacted [facility] about d/c ([facility]'s #1 choice and they were considering the patient in [facility]) [facility] stated they would be unable to complete a weekend admission, to try back Monday. CSW provided support and education on how to transfer the patient to another facility Monday. [facility] agreed to d/c the patient to [facility] for the weekend until a transfer could be made. CSW will contact [facility] to confirm weekend d/c.   Assessment/plan status:  Information/Referral to [facility] Other assessment/ plan:   Information/referral to community resources:    PATIENT'S/FAMILY'S RESPONSE TO PLAN OF CARE: Patient's daughter thanked CSW for support and assistance on transferring patient to another facility.   Lia Foyer, LCSWA [facility] Clinical Social Worker Contact #: [phone] (weekend)

## 2012-06-04 NOTE — Clinical Social Work Note (Signed)
CSW was consulted to complete discharge of patient. Pt to transfer to [facility] today via [facility]. [facility] and daughter, Andrey Campanile are aware of d/c. D/C packet complete with chart copy, signed FL2, and signed hard Rx.  CSW signing off as no other CSW needs identified at this time.  Lia Foyer, LCSWA [facility] Clinical Social Worker Contact #: [phone] (weekend)

## 2012-06-07 ENCOUNTER — Encounter: Payer: Medicare Other | Admitting: Physician Assistant

## 2012-06-08 LAB — CULTURE, BLOOD (ROUTINE X 2)

## 2012-06-13 ENCOUNTER — Other Ambulatory Visit (HOSPITAL_BASED_OUTPATIENT_CLINIC_OR_DEPARTMENT_OTHER): Payer: Self-pay

## 2012-06-13 DIAGNOSIS — R609 Edema, unspecified: Secondary | ICD-10-CM

## 2012-06-13 DIAGNOSIS — L539 Erythematous condition, unspecified: Secondary | ICD-10-CM

## 2012-06-14 ENCOUNTER — Ambulatory Visit (HOSPITAL_BASED_OUTPATIENT_CLINIC_OR_DEPARTMENT_OTHER): Payer: Medicare Other

## 2012-06-16 ENCOUNTER — Other Ambulatory Visit (HOSPITAL_BASED_OUTPATIENT_CLINIC_OR_DEPARTMENT_OTHER): Payer: Self-pay | Admitting: Internal Medicine

## 2012-06-16 DIAGNOSIS — R609 Edema, unspecified: Secondary | ICD-10-CM

## 2012-06-16 DIAGNOSIS — M79652 Pain in left thigh: Secondary | ICD-10-CM

## 2012-06-16 DIAGNOSIS — R600 Localized edema: Secondary | ICD-10-CM

## 2012-06-17 ENCOUNTER — Other Ambulatory Visit (HOSPITAL_BASED_OUTPATIENT_CLINIC_OR_DEPARTMENT_OTHER): Payer: Self-pay | Admitting: Internal Medicine

## 2012-06-17 ENCOUNTER — Ambulatory Visit (HOSPITAL_COMMUNITY)
Admission: RE | Admit: 2012-06-17 | Discharge: 2012-06-17 | Disposition: A | Discharge status: Home / Self Care | Payer: Medicare Other | Source: Ambulatory Visit | Attending: Internal Medicine | Admitting: Internal Medicine

## 2012-06-17 ENCOUNTER — Ambulatory Visit (HOSPITAL_COMMUNITY): Payer: Medicare Other

## 2012-06-17 DIAGNOSIS — K439 Ventral hernia without obstruction or gangrene: Secondary | ICD-10-CM | POA: Insufficient documentation

## 2012-06-17 DIAGNOSIS — E279 Disorder of adrenal gland, unspecified: Secondary | ICD-10-CM | POA: Insufficient documentation

## 2012-06-17 DIAGNOSIS — R609 Edema, unspecified: Secondary | ICD-10-CM

## 2012-06-17 DIAGNOSIS — M7989 Other specified soft tissue disorders: Secondary | ICD-10-CM | POA: Insufficient documentation

## 2012-06-17 DIAGNOSIS — M79652 Pain in left thigh: Secondary | ICD-10-CM

## 2012-06-17 MED ORDER — IOHEXOL 350 MG/ML SOLN
250.0000 mL | Freq: Once | INTRAVENOUS | Status: AC | PRN
  Administered 2012-06-17: 250 mL via INTRAVENOUS

## 2012-06-27 ENCOUNTER — Other Ambulatory Visit (HOSPITAL_BASED_OUTPATIENT_CLINIC_OR_DEPARTMENT_OTHER): Payer: Self-pay | Admitting: Internal Medicine

## 2012-06-28 ENCOUNTER — Other Ambulatory Visit (HOSPITAL_BASED_OUTPATIENT_CLINIC_OR_DEPARTMENT_OTHER): Payer: Self-pay | Admitting: Internal Medicine

## 2012-06-28 DIAGNOSIS — K869 Disease of pancreas, unspecified: Secondary | ICD-10-CM

## 2012-07-07 ENCOUNTER — Ambulatory Visit
Admission: RE | Admit: 2012-07-07 | Discharge: 2012-07-07 | Disposition: A | Discharge status: Home / Self Care | Payer: Medicare Other | Source: Ambulatory Visit | Attending: Internal Medicine | Admitting: Internal Medicine

## 2012-07-07 ENCOUNTER — Other Ambulatory Visit: Payer: Medicare Other

## 2012-07-07 DIAGNOSIS — K869 Disease of pancreas, unspecified: Secondary | ICD-10-CM

## 2012-07-07 MED ORDER — GADOBENATE DIMEGLUMINE 529 MG/ML IV SOLN
20.0000 mL | Freq: Once | INTRAVENOUS | Status: AC | PRN
Start: 2012-07-07 — End: 2012-07-07
  Administered 2012-07-07: 20 mL via INTRAVENOUS

## 2012-07-14 ENCOUNTER — Encounter (HOSPITAL_COMMUNITY): Payer: Self-pay | Admitting: *Deleted

## 2012-07-14 ENCOUNTER — Inpatient Hospital Stay (HOSPITAL_COMMUNITY)
Admission: EM | Admit: 2012-07-14 | Discharge: 2012-07-17 | DRG: 243 | Disposition: A | Discharge status: Home / Self Care | Payer: Medicare Other | Attending: Cardiology | Admitting: Cardiology

## 2012-07-14 ENCOUNTER — Emergency Department (HOSPITAL_COMMUNITY): Payer: Medicare Other

## 2012-07-14 DIAGNOSIS — I119 Hypertensive heart disease without heart failure: Secondary | ICD-10-CM | POA: Diagnosis present

## 2012-07-14 DIAGNOSIS — I872 Venous insufficiency (chronic) (peripheral): Secondary | ICD-10-CM | POA: Diagnosis present

## 2012-07-14 DIAGNOSIS — E119 Type 2 diabetes mellitus without complications: Secondary | ICD-10-CM | POA: Diagnosis present

## 2012-07-14 DIAGNOSIS — I495 Sick sinus syndrome: Principal | ICD-10-CM | POA: Diagnosis present

## 2012-07-14 DIAGNOSIS — R55 Syncope and collapse: Secondary | ICD-10-CM | POA: Diagnosis present

## 2012-07-14 DIAGNOSIS — R0609 Other forms of dyspnea: Secondary | ICD-10-CM

## 2012-07-14 DIAGNOSIS — Z950 Presence of cardiac pacemaker: Secondary | ICD-10-CM | POA: Diagnosis not present

## 2012-07-14 DIAGNOSIS — J4489 Other specified chronic obstructive pulmonary disease: Secondary | ICD-10-CM | POA: Diagnosis present

## 2012-07-14 DIAGNOSIS — R001 Bradycardia, unspecified: Secondary | ICD-10-CM | POA: Diagnosis present

## 2012-07-14 DIAGNOSIS — J449 Chronic obstructive pulmonary disease, unspecified: Secondary | ICD-10-CM | POA: Diagnosis present

## 2012-07-14 DIAGNOSIS — Z8679 Personal history of other diseases of the circulatory system: Secondary | ICD-10-CM

## 2012-07-14 DIAGNOSIS — IMO0002 Reserved for concepts with insufficient information to code with codable children: Secondary | ICD-10-CM | POA: Diagnosis present

## 2012-07-14 DIAGNOSIS — I10 Essential (primary) hypertension: Secondary | ICD-10-CM

## 2012-07-14 DIAGNOSIS — Z87891 Personal history of nicotine dependence: Secondary | ICD-10-CM

## 2012-07-14 DIAGNOSIS — Z6841 Body Mass Index (BMI) 40.0 and over, adult: Secondary | ICD-10-CM

## 2012-07-14 DIAGNOSIS — I441 Atrioventricular block, second degree: Secondary | ICD-10-CM | POA: Diagnosis present

## 2012-07-14 DIAGNOSIS — G8929 Other chronic pain: Secondary | ICD-10-CM

## 2012-07-14 DIAGNOSIS — L02419 Cutaneous abscess of limb, unspecified: Secondary | ICD-10-CM | POA: Diagnosis present

## 2012-07-14 DIAGNOSIS — I4510 Unspecified right bundle-branch block: Secondary | ICD-10-CM | POA: Diagnosis present

## 2012-07-14 DIAGNOSIS — I878 Other specified disorders of veins: Secondary | ICD-10-CM | POA: Diagnosis present

## 2012-07-14 DIAGNOSIS — E1165 Type 2 diabetes mellitus with hyperglycemia: Secondary | ICD-10-CM | POA: Diagnosis present

## 2012-07-14 DIAGNOSIS — I442 Atrioventricular block, complete: Secondary | ICD-10-CM

## 2012-07-14 DIAGNOSIS — E785 Hyperlipidemia, unspecified: Secondary | ICD-10-CM | POA: Diagnosis present

## 2012-07-14 DIAGNOSIS — Z79899 Other long term (current) drug therapy: Secondary | ICD-10-CM

## 2012-07-14 LAB — CBC
HCT: 34.5 % — ABNORMAL LOW (ref 39.0–52.0)
Hemoglobin: 11 g/dL — ABNORMAL LOW (ref 13.0–17.0)
RDW: 14.8 % (ref 11.5–15.5)
WBC: 8.1 10*3/uL (ref 4.0–10.5)

## 2012-07-14 LAB — BASIC METABOLIC PANEL
BUN: 27 mg/dL — ABNORMAL HIGH (ref 6–23)
Chloride: 102 mEq/L (ref 96–112)
GFR calc Af Amer: 71 mL/min — ABNORMAL LOW (ref >90)
Glucose, Bld: 110 mg/dL — ABNORMAL HIGH (ref 70–99)
Potassium: 4.6 mEq/L (ref 3.5–5.1)
Sodium: 135 mEq/L (ref 135–145)

## 2012-07-14 LAB — TROPONIN I: Troponin I: <0.3 ng/mL (ref <0.30)

## 2012-07-14 LAB — MRSA PCR SCREENING: MRSA by PCR: NEGATIVE

## 2012-07-14 MED ORDER — INSULIN ASPART 100 UNIT/ML ~~LOC~~ SOLN: 0.0000 [IU] | Freq: Every day | SUBCUTANEOUS | Status: DC

## 2012-07-14 MED ORDER — ALUM & MAG HYDROXIDE-SIMETH 200-200-20 MG/5ML PO SUSP: 30.0000 mL | Freq: Four times a day (QID) | ORAL | Status: DC | PRN

## 2012-07-14 MED ORDER — ACETAMINOPHEN 325 MG PO TABS: 650.0000 mg | ORAL_TABLET | Freq: Four times a day (QID) | ORAL | Status: DC | PRN

## 2012-07-14 MED ORDER — ACETAMINOPHEN 650 MG RE SUPP: 650.0000 mg | Freq: Four times a day (QID) | RECTAL | Status: DC | PRN

## 2012-07-14 MED ORDER — SODIUM CHLORIDE 0.9 % IJ SOLN
3.0000 mL | Freq: Two times a day (BID) | INTRAMUSCULAR | Status: DC
  Administered 2012-07-14 – 2012-07-17 (×4): 3 mL via INTRAVENOUS

## 2012-07-14 MED ORDER — POTASSIUM CHLORIDE CRYS ER 10 MEQ PO TBCR
10.0000 meq | EXTENDED_RELEASE_TABLET | Freq: Two times a day (BID) | ORAL | Status: DC
  Administered 2012-07-14 – 2012-07-17 (×6): 10 meq via ORAL
  Filled 2012-07-14 (×7): qty 1

## 2012-07-14 MED ORDER — DOCUSATE SODIUM 100 MG PO CAPS
100.0000 mg | ORAL_CAPSULE | Freq: Two times a day (BID) | ORAL | Status: DC
  Administered 2012-07-16 – 2012-07-17 (×2): 100 mg via ORAL
  Filled 2012-07-14 (×7): qty 1

## 2012-07-14 MED ORDER — SENNA 8.6 MG PO TABS
1.0000 | ORAL_TABLET | Freq: Two times a day (BID) | ORAL | Status: DC
  Administered 2012-07-16 – 2012-07-17 (×2): 8.6 mg via ORAL
  Filled 2012-07-14 (×7): qty 1

## 2012-07-14 MED ORDER — OXYCODONE-ACETAMINOPHEN 5-325 MG PO TABS
1.0000 | ORAL_TABLET | ORAL | Status: DC | PRN
  Administered 2012-07-15 – 2012-07-17 (×7): 1 via ORAL
  Filled 2012-07-14 (×7): qty 1

## 2012-07-14 MED ORDER — AMLODIPINE BESYLATE 5 MG PO TABS
5.0000 mg | ORAL_TABLET | Freq: Every day | ORAL | Status: DC
  Administered 2012-07-15 – 2012-07-17 (×3): 5 mg via ORAL
  Filled 2012-07-14 (×3): qty 1

## 2012-07-14 MED ORDER — ALBUTEROL SULFATE HFA 108 (90 BASE) MCG/ACT IN AERS: 4.0000 | INHALATION_SPRAY | RESPIRATORY_TRACT | Status: DC | PRN

## 2012-07-14 MED ORDER — GLIPIZIDE ER 10 MG PO TB24
10.0000 mg | ORAL_TABLET | Freq: Two times a day (BID) | ORAL | Status: DC
  Administered 2012-07-16 (×2): 10 mg via ORAL
  Filled 2012-07-14 (×8): qty 1

## 2012-07-14 MED ORDER — MELOXICAM 15 MG PO TABS
15.0000 mg | ORAL_TABLET | Freq: Every day | ORAL | Status: DC
  Administered 2012-07-15 – 2012-07-17 (×3): 15 mg via ORAL
  Filled 2012-07-14 (×3): qty 1

## 2012-07-14 MED ORDER — FUROSEMIDE 20 MG PO TABS
20.0000 mg | ORAL_TABLET | ORAL | Status: DC
  Administered 2012-07-15: 20 mg via ORAL
  Filled 2012-07-14: qty 1

## 2012-07-14 MED ORDER — INSULIN ASPART 100 UNIT/ML ~~LOC~~ SOLN: 0.0000 [IU] | Freq: Three times a day (TID) | SUBCUTANEOUS | Status: DC

## 2012-07-14 MED ORDER — IRBESARTAN 150 MG PO TABS
150.0000 mg | ORAL_TABLET | Freq: Every day | ORAL | Status: DC
  Administered 2012-07-15 – 2012-07-17 (×3): 150 mg via ORAL
  Filled 2012-07-14 (×3): qty 1

## 2012-07-14 MED ORDER — ZOLPIDEM TARTRATE 5 MG PO TABS
5.0000 mg | ORAL_TABLET | Freq: Every evening | ORAL | Status: DC | PRN
  Administered 2012-07-14: 5 mg via ORAL
  Filled 2012-07-14: qty 1

## 2012-07-14 MED ORDER — OXYCODONE HCL 5 MG PO TABS
5.0000 mg | ORAL_TABLET | ORAL | Status: DC | PRN
  Administered 2012-07-14 – 2012-07-17 (×9): 5 mg via ORAL
  Filled 2012-07-14 (×9): qty 1

## 2012-07-14 NOTE — ED Provider Notes (Signed)
History     CSN: [id number]  Arrival date & time 07/14/12  1630   First MD Initiated Contact with Patient 07/14/12 1632      Chief Complaint  Patient presents with  . Near Syncope    (Consider location/radiation/quality/duration/timing/severity/associated sxs/prior treatment) HPI Comments: Luis Carrillo presents for evaluation of exertional dyspnea, weakness, and near syncope.  He was recently discharged from the hospital after evaluation of a soft tissue infection in his left leg.  He reports the swelling and redness have greatly improved.  Over the last 24 hours he feels extremely lightheaded each time he stands.  Using his walker he can barely walk 3 feet without feeling fatigued.  He also reports being unable to stand and urinate because of feeling as if he would pass out.  He denies fever, CP, cough, abd pain, NVD, and unilater weakness.  Yesterday he did faint briefly while in his kitchen.  Patient is a 79 y.o. male presenting with syncope. The history is provided by the patient. No language interpreter was used.  Loss of Consciousness This is a new problem. The current episode started yesterday. The problem has been gradually worsening. Associated symptoms include shortness of breath. Pertinent negatives include no chest pain, no abdominal pain and no headaches. The symptoms are aggravated by walking, bending, exertion and standing. The symptoms are relieved by rest (remaining still).    Past Medical History  Diagnosis Date  . Diabetes mellitus without complication   . COPD (chronic obstructive pulmonary disease)   . Hypertension     History reviewed. No pertinent past surgical history.  No family history on file.  History  Substance Use Topics  . Smoking status: Former Smoker -- 1.0 packs/day for 20 years    Types: Cigarettes    Quit date: 05/24/1969  . Smokeless tobacco: Never Used  . Alcohol Use:       Review of Systems  Respiratory: Positive for shortness of breath.    Cardiovascular: Positive for syncope. Negative for chest pain.  Gastrointestinal: Negative for abdominal pain.  Neurological: Negative for headaches.  All other systems reviewed and are negative.    Allergies  Alka-seltzer heartburn; Citric acid; Potassium bicarbonate; and Sodium bicarbonate  Home Medications   Current Outpatient Rx  Name  Route  Sig  Dispense  Refill  . ALBUTEROL SULFATE HFA 108 (90 BASE) MCG/ACT IN AERS   Inhalation   Inhale 4 puffs into the lungs every 2 (two) hours as needed for wheezing.         Marland Kitchen AMLODIPINE BESYLATE 5 MG PO TABS   Oral   Take 5 mg by mouth daily.         . FUROSEMIDE 20 MG PO TABS   Oral   Take 20 mg by mouth every Monday, Wednesday, and Friday.         Marland Kitchen GLIPIZIDE ER 10 MG PO TB24   Oral   Take 10 mg by mouth 2 (two) times daily.         . MELOXICAM 15 MG PO TABS   Oral   Take 15 mg by mouth daily.         Marland Kitchen METFORMIN HCL 500 MG PO TABS   Oral   Take 500 mg by mouth 2 (two) times daily with a meal.         . ADULT MULTIVITAMIN W/MINERALS CH   Oral   Take 1 tablet by mouth daily.         Marland Kitchen  OXYCODONE-ACETAMINOPHEN 5-325 MG PO TABS   Oral   Take 1 tablet by mouth every 4 (four) hours as needed. For pain         . POTASSIUM CHLORIDE CRYS ER 10 MEQ PO TBCR   Oral   Take 10 mEq by mouth 2 (two) times daily.         Marland Kitchen VALSARTAN 160 MG PO TABS   Oral   Take 160 mg by mouth daily.         Marland Kitchen VITAMIN E 400 UNITS PO CAPS   Oral   Take 400 Units by mouth daily.         Marland Kitchen ZOLPIDEM TARTRATE 5 MG PO TABS   Oral   Take 1 tablet (5 mg total) by mouth at bedtime as needed for sleep.   30 tablet   0     BP 144/48  Pulse 44  Resp 17  SpO2 99%  Physical Exam  Nursing note and vitals reviewed. Constitutional: He is oriented to person, place, and time. He appears well-developed and well-nourished. No distress.       Pt is morbidly obese  HENT:  Head: Normocephalic and atraumatic.  Right Ear:  External ear normal.  Left Ear: External ear normal.  Nose: Nose normal.  Mouth/Throat: Oropharynx is clear and moist. No oropharyngeal exudate.  Eyes: Conjunctivae normal are normal. Pupils are equal, round, and reactive to light. Right eye exhibits no discharge. No scleral icterus.  Neck: Normal range of motion. Neck supple. No JVD present. No tracheal deviation present.  Cardiovascular: Intact distal pulses and normal pulses.  An irregular rhythm present.  No extrasystoles are present. Bradycardia present.  PMI is not displaced.  Exam reveals distant heart sounds. Exam reveals no gallop and no decreased pulses.   No murmur heard. Pulmonary/Chest: Effort normal and breath sounds normal. No stridor. No respiratory distress. He has no wheezes. He has no rales. He exhibits no tenderness.  Abdominal: Soft. Bowel sounds are normal. He exhibits no distension and no mass. There is no tenderness. There is no rebound and no guarding.  Musculoskeletal: Normal range of motion. He exhibits tenderness. Edema: 1+ right, 2 + left.  Lymphadenopathy:    He has no cervical adenopathy.  Neurological: He is alert and oriented to person, place, and time. No cranial nerve deficit. He exhibits normal muscle tone. Coordination normal.  Skin: Skin is warm and dry. No rash noted. He is not diaphoretic. No erythema. No pallor.  Psychiatric: He has a normal mood and affect. His behavior is normal.    ED Course  Procedures (including critical care time)   Labs Reviewed  CBC  BASIC METABOLIC PANEL  MAGNESIUM  TROPONIN I   No results found.   No diagnosis found.   Date: 07/14/2012 @ 16:40  Rate: 40 bpm  Rhythm: junctional rhythm  QRS Axis: normal  Intervals: absent PR int  ST/T Wave abnormalities: nonspecific ST/T changes  Conduction Disutrbances:right bundle branch block  Narrative Interpretation:   Old EKG Reviewed: previous study demonstrates sinus bradycardia with a RBBB      MDM  Pt presents  for evaluation of exertional dyspnea and syncope.  He appears nontoxic, note no focal neurologic deficits, NAD.  He has an irregular bradycardia on the monitor with a rate in the mid 30s to mid 40s.  He was recently admitted for a soft tissue infection but one of the complications encountered during the admission was heart block.  Will place pacer pads  as a precautionary measure.  Will obtain basic labs, trop, CXR, and orthostatic vital signs.  Suspect he has issues with sick sinus syndrome as he is not currently taking any medications that would generally cause significant bradycardia.  As results become available, will contact his cardiologist for evaluation.  1735.  Pt will be evaluated at the bedside by Dr. Dione Housekeeper (cardiology).  1815.  Dr. Anne Fu is at the bedside.  Plan admit for further evaluation.  His exam is consistent with a sick sinus syndrome.  Pt became acutely SOB and sweaty when attempting to move for a CXR.  He continues to deny any pain.  CRITICAL CARE Performed by: Dana Allan T   Total critical care time: 30 min  Critical care time was exclusive of separately billable procedures and treating other patients.  Critical care was necessary to treat or prevent imminent or life-threatening deterioration.  Critical care was time spent personally by me on the following activities: development of treatment plan with patient and/or surrogate as well as nursing, discussions with consultants, evaluation of patient's response to treatment, examination of patient, obtaining history from patient or surrogate, ordering and performing treatments and interventions, ordering and review of laboratory studies, ordering and review of radiographic studies, pulse oximetry and re-evaluation of patient's condition.        Tobin Chad, MD 07/14/12 [phone]

## 2012-07-14 NOTE — ED Notes (Signed)
The pt has been seen by the admitting doctor.  No distress daughter remains at the bedsdie

## 2012-07-14 NOTE — ED Notes (Signed)
The pts rhy is sinus bradycardia.  The edp wants pacer pads in place but not  Turned on

## 2012-07-14 NOTE — ED Notes (Signed)
Patient moved to another bed became shortness of breath Doctor notified. Discontinued 2 view ordered portable 1 view chest x-ray. Shortness of breath decreased upon resting.

## 2012-07-14 NOTE — ED Notes (Signed)
The pt has had a low bp and almost fainted  Since yesterday intermittently.  Released from this hospital recently.  Multiple health problems.  He arrived by [facility] ems from home.  No sob. Alert constant talking since his arrival.  No iv access ems unable to stick

## 2012-07-14 NOTE — ED Notes (Signed)
Report called to 2900 

## 2012-07-14 NOTE — H&P (Addendum)
Admit date: 07/14/2012 Primary Physician: Dr. Zachery Dauer (Was Dr. Leanord Hawking at [facility] but now discharged) Primary Cardiologist : Dr. Donnie Aho  CC: Syncope  HPI: 79 year old male with diabetes, morbid obesity, COPD, hypertension, chronic venous insufficiency with recent prolonged hospitalization for severe left lower extremity cellulitis requiring PICC line, vancomycin, Zosyn and rehabilitation stay at [facility] (PICC line removed 06/27/12) who is now at home and was doing well with his rehabilitation efforts until yesterday when he had a near syncopal/syncopal episode. He was walking down the hallway in his house and felt extremely fatigued as well as dyspnea and he sat down in a chair and the next thing he knows he woke up and saw the chair next to him laying on its side and he believes he lost consciousness for a brief moment. He states that he felt the sensation of "head rush ". Today he has been "wiped out ". He has been ambulating with a walker since rehabilitation because of deconditioning as well as chronic knee pain and every time he tries to move around today he has been dizzy at times and has felt more shortness of breath than usual.  He called Dr. York Spaniel office earlier today and an appointment was made for him tomorrow and a monitor was going to be sent out to his house for further evaluation.  On the onset of his prior hospitalization, his severe leg cellulitis episode, he had complete heart block which was transient. It was thought at the time that this was likely secondary to his overwhelming infectious process.   Here in the emergency department, EKG demonstrates sinus bradycardia right bundle branch block with heart rate of 40. While he is laying in bed, telemetry is demonstrating sinus bradycardia rate of 36. He is currently asymptomatic laying comfortably in bed. No shortness of breath. He has not described any recent fevers, chills, cough. His cellulitis has resolved. He is on no AV  nodal blocking agents at home.  A recent discovery of a pancreatic lesion was noted from a CT scan previously done to evaluate arterial vasculature. Please see MRI results as below.    PMH:   Past Medical History  Diagnosis Date  . Diabetes mellitus without complication   . COPD (chronic obstructive pulmonary disease)   . Hypertension     PSH:  History reviewed. No pertinent past surgical history. Allergies:  Alka-seltzer heartburn; Citric acid; Potassium bicarbonate; and Sodium bicarbonate Prior to Admit Meds:   (Not in a hospital admission) Prior to Admission medications   Medication Sig Start Date End Date Taking? Authorizing Provider  albuterol (PROVENTIL HFA;VENTOLIN HFA) 108 (90 BASE) MCG/ACT inhaler Inhale 4 puffs into the lungs every 2 (two) hours as needed for wheezing. 05/31/12  Yes Zannie Cove, MD  amLODipine (NORVASC) 5 MG tablet Take 5 mg by mouth daily.   Yes Historical Provider, MD  furosemide (LASIX) 20 MG tablet Take 20 mg by mouth every Monday, Wednesday, and Friday.   Yes Historical Provider, MD  glipiZIDE (GLUCOTROL XL) 10 MG 24 hr tablet Take 10 mg by mouth 2 (two) times daily.   Yes Historical Provider, MD  meloxicam (MOBIC) 15 MG tablet Take 15 mg by mouth daily.   Yes Historical Provider, MD  metFORMIN (GLUCOPHAGE) 500 MG tablet Take 500 mg by mouth 2 (two) times daily with a meal.   Yes Historical Provider, MD  Multiple Vitamin (MULTIVITAMIN WITH MINERALS) TABS Take 1 tablet by mouth daily.   Yes Historical Provider, MD  oxyCODONE-acetaminophen (  PERCOCET/ROXICET) 5-325 MG per tablet Take 1 tablet by mouth every 4 (four) hours as needed. For pain   Yes Historical Provider, MD  potassium chloride (K-DUR,KLOR-CON) 10 MEQ tablet Take 10 mEq by mouth 2 (two) times daily.   Yes Historical Provider, MD  valsartan (DIOVAN) 160 MG tablet Take 160 mg by mouth daily.   Yes Historical Provider, MD  vitamin E 400 UNIT capsule Take 400 Units by mouth daily.   Yes Historical  Provider, MD  zolpidem (AMBIEN) 5 MG tablet Take 1 tablet (5 mg total) by mouth at bedtime as needed for sleep. 06/04/12  Yes Estela Isaiah Blakes, MD    Fam HX:   No family history on file. Currently noncontributory.  Social HX:    History   Social History  . Marital Status: Married    Spouse Name: N/A    Number of Children: N/A  . Years of Education: N/A   Occupational History  . Not on file.   Social History Main Topics  . Smoking status: Former Smoker -- 1.0 packs/day for 20 years    Types: Cigarettes    Quit date: 05/24/1969  . Smokeless tobacco: Never Used  . Alcohol Use:   . Drug Use:   . Sexually Active:    Other Topics Concern  . 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      ROS: Denies any anginal symptoms, no chest pain, no fevers, no chills, no bleeding, no rashes, no orthopnea, no PND, no strokelike symptoms.  All 11 ROS were addressed and are negative except what is stated in the HPI  Physical Exam: Blood pressure 139/51, pulse 36, resp. rate 20, SpO2 100.00%.    General: Well developed, well nourished, in no acute distress Head: Eyes PERRLA, No xanthomas.   Normal cephalic and atramatic  Lungs:   Clear bilaterally to auscultation and percussion. Normal respiratory effort. No wheezes, no rales. Heart:   Bradycardic, regular rhythm, 2/6 systolic murmur right upper sternal border, difficult to palpate PMI.      No carotid bruit. No JVD.  No abdominal bruits. Abdomen: Bowel sounds are positive, abdomen soft and non-tender without masses  No hepatosplenomegaly. Obese Msk:  Back normal, normal gait. Normal strength and tone for age. Extremities:  Chronic venous insufficiency changes noted left greater than right. No evidence of current infection. Trace edema. Palpable distal dorsalis pedis pulses.  Neuro: Alert and oriented X 3, non-focal, MAE x 4 GU:  Deferred Rectal: Deferred Psych:  Good affect, responds appropriately    Labs:   Lab Results  Component Value Date   WBC 8.1 07/14/2012   HGB 11.0* 07/14/2012   HCT 34.5* 07/14/2012   MCV 91.5 07/14/2012   PLT 220 07/14/2012     Lab 07/14/12 1710  NA 135  K 4.6  CL 102  CO2 18*  BUN 27*  CREATININE 1.11  CALCIUM 9.2  PROT --  BILITOT --  ALKPHOS --  ALT --  AST --  GLUCOSE 110*   No results found for this basename: PTT   Lab Results  Component Value Date   INR 1.21 05/22/2012   Lab Results  Component Value Date   TROPONINI <0.30 07/14/2012      Radiology:  Dg Chest Portable 1 View  07/14/2012  *RADIOLOGY REPORT*  Clinical Data: Near-syncope.  PORTABLE CHEST - 1  VIEW  Comparison: 05/26/2012.  Findings: Trachea is midline.  Heart size is grossly stable. Defibrillator pads project over the left chest.  Lungs are grossly clear.  No right pleural effusion.  Left costophrenic angle is not included on the image.  IMPRESSION: No definite acute findings.   Original Report Authenticated By: Leanna Battles, M.D.    Personally viewed.   EKG:  As described above, sinus bradycardia right bundle branch block heart rate 40. Personally viewed.  Echocardiogram-05/23/2012 - Left ventricle: The cavity size was normal. Systolic function was normal. The estimated ejection fraction was in the range of 60% to 65%. Although no diagnostic regional wall motion abnormality was identified, this possibility cannot be completely excluded on the basis of this study. - Aortic valve: There was mild stenosis. Valve area: 1.93cm^2(VTI). Valve area: 1.98cm^2 (Vmax). - Mitral valve: Mild regurgitation. - Left atrium: The atrium was mildly dilated.  Lower extremity ultrasound on 05/23/12 demonstrated no DVT   MRI of abdomen 07/07/12 IMPRESSION:  The lesion in the tail the pancreas represents a 2.0 x 1.23 cm  simple and elongated cystic structure with no internal septation or  mural  thickening/irregularity. Other scattered very tiny cystic  foci are seen peppered through the pancreatic parenchyma. The  dominant tail lesion is most likely a benign finding. Follow-up  MRI in 6 months could be used to further evaluate This  recommendation follows ACR consensus guidelines: Managing  Incidental Findings on Abdominal CT: White Paper of the ACR  Incidental Findings Committee. J Am Coll Radiol 2010;7:754-773 .  2.3 cm left adrenal nodule represents a benign adrenal adenoma.  Probable hemangioma in the L1 vertebral body. This could also be  reassessed at the time of follow-up MRI.  1 cm indeterminate enhancing focus in the anterior spleen could be  reassessed for stability at the time of follow-up.   ASSESSMENT/PLAN:   79 year old male with recent cellulitis having received IV antibiotics, rehabilitation stay, currently appears infection free with diabetes, hypertension, morbid obesity and current symptomatic bradycardia/syncope, sinoatrial node dysfunction, right bundle branch block with prior episode of complete heart block during his original presentation with cellulitis.  1. Symptomatic bradycardia-we will go ahead and admit to the CCU. Pacer pads are placed in case back up as needed. I mentioned to him the possibility of temporary pacemaker wire if he were to deteriorate. Now he is asymptomatic with a heart rate of 36 and appears to be in sinus bradycardia. Current EKG does not demonstrate complete heart block. Given his recent symptom of syncope/near syncope I do believe that pacemaker will be required. He is not on any AV nodal blocking agents. TSH on 05/23/12 was 0.5. Electrolytes are currently normal.   I have described to him pacemaker procedure. I described the risks and benefits of the procedure including infection, pneumothorax. He is willing to proceed. I will make him n.p.o. past midnight. His white count is normal. Once again there does not appear to be any active  signs of infection. He has taken a full course of IV antibiotics. His PICC line was removed in late December. He is afebrile.  2. Diabetes-continue current home medications, hold metformin. I will place him on insulins sliding scale.  3. Morbid obesity-encourage weight loss.  4. Hypertension-continue to monitor. Continue with outpatient medications.  5. Pancreatic lesion-please see MRI report as above.  Donato Schultz, MD  07/14/2012  6:31 PM

## 2012-07-14 NOTE — ED Notes (Signed)
Paged Dr. Anne Fu to [phone]

## 2012-07-14 NOTE — ED Notes (Signed)
The pts family is at the bedside.  The pt remains alert and reports that he gets sob when he is walking.  He has also felt like passing out earlier  Today when he returned from the br he became sob and felt like he was going to pass out.  His daughter at the house stated he looked white and very sob.  Hx complete heart blocki

## 2012-07-14 NOTE — ED Notes (Signed)
Myself and Tiffany, EMT undressed pt, placed in gown, on monitor, continuous pulse oximetry and blood pressure cuff; vitals taken and EKG performed by Elmarie Shiley, EMT

## 2012-07-14 NOTE — ED Notes (Signed)
Pacer pads placed on pt per Powers, MD

## 2012-07-14 NOTE — ED Notes (Signed)
Pt sitting  With pt.  Waiting for room assignment

## 2012-07-14 NOTE — ED Notes (Signed)
Waiting on the room assignment

## 2012-07-14 NOTE — ED Notes (Signed)
No pain or sob unless he stands

## 2012-07-14 NOTE — ED Notes (Signed)
2 view chest ordered  Question to the edp about a portable since he wants the pt on pacer pads.  okd to go to [facility] for the 2 viiew dr powers edp

## 2012-07-14 NOTE — ED Notes (Signed)
edp at the bedside 

## 2012-07-15 ENCOUNTER — Encounter (HOSPITAL_COMMUNITY)
Admission: EM | Disposition: A | Discharge status: Home / Self Care | Payer: Self-pay | Source: Home / Self Care | Attending: Cardiology

## 2012-07-15 DIAGNOSIS — Z8679 Personal history of other diseases of the circulatory system: Secondary | ICD-10-CM

## 2012-07-15 DIAGNOSIS — Z950 Presence of cardiac pacemaker: Secondary | ICD-10-CM | POA: Diagnosis not present

## 2012-07-15 DIAGNOSIS — I441 Atrioventricular block, second degree: Secondary | ICD-10-CM

## 2012-07-15 DIAGNOSIS — E785 Hyperlipidemia, unspecified: Secondary | ICD-10-CM | POA: Diagnosis present

## 2012-07-15 DIAGNOSIS — R55 Syncope and collapse: Secondary | ICD-10-CM

## 2012-07-15 HISTORY — PX: PERMANENT PACEMAKER INSERTION: SHX5480

## 2012-07-15 LAB — CBC
MCH: 30.1 pg (ref 26.0–34.0)
MCV: 92.4 fL (ref 78.0–100.0)
Platelets: 216 10*3/uL (ref 150–400)
RBC: 3.69 MIL/uL — ABNORMAL LOW (ref 4.22–5.81)
RDW: 14.8 % (ref 11.5–15.5)

## 2012-07-15 LAB — BASIC METABOLIC PANEL
CO2: 25 mEq/L (ref 19–32)
Calcium: 8.9 mg/dL (ref 8.4–10.5)
Creatinine, Ser: 1.31 mg/dL (ref 0.50–1.35)
Glucose, Bld: 93 mg/dL (ref 70–99)

## 2012-07-15 LAB — GLUCOSE, CAPILLARY
Glucose-Capillary: 101 mg/dL — ABNORMAL HIGH (ref 70–99)
Glucose-Capillary: 98 mg/dL (ref 70–99)
Glucose-Capillary: 86 mg/dL (ref 70–99)

## 2012-07-15 SURGERY — PERMANENT PACEMAKER INSERTION
Anesthesia: LOCAL

## 2012-07-15 MED ORDER — SODIUM CHLORIDE 0.9 % IJ SOLN: 3.0000 mL | Freq: Two times a day (BID) | INTRAMUSCULAR | Status: DC

## 2012-07-15 MED ORDER — SODIUM CHLORIDE 0.45 % IV SOLN
INTRAVENOUS | Status: DC
  Administered 2012-07-15: 09:00:00 via INTRAVENOUS

## 2012-07-15 MED ORDER — SODIUM CHLORIDE 0.9 % IR SOLN
80.0000 mg | Status: DC
  Filled 2012-07-15: qty 2

## 2012-07-15 MED ORDER — LIDOCAINE HCL (PF) 1 % IJ SOLN
INTRAMUSCULAR | Status: AC
  Filled 2012-07-15: qty 60

## 2012-07-15 MED ORDER — CHLORHEXIDINE GLUCONATE 4 % EX LIQD
60.0000 mL | Freq: Once | CUTANEOUS | Status: AC
  Administered 2012-07-15: 4 via TOPICAL
  Filled 2012-07-15: qty 60

## 2012-07-15 MED ORDER — ONDANSETRON HCL 4 MG/2ML IJ SOLN: 4.0000 mg | Freq: Four times a day (QID) | INTRAMUSCULAR | Status: DC | PRN

## 2012-07-15 MED ORDER — FENTANYL CITRATE 0.05 MG/ML IJ SOLN
INTRAMUSCULAR | Status: AC
  Filled 2012-07-15: qty 2

## 2012-07-15 MED ORDER — HEPARIN (PORCINE) IN NACL 2-0.9 UNIT/ML-% IJ SOLN
INTRAMUSCULAR | Status: AC
  Filled 2012-07-15: qty 500

## 2012-07-15 MED ORDER — SODIUM CHLORIDE 0.9 % IV SOLN
250.0000 mL | INTRAVENOUS | Status: DC
  Administered 2012-07-15: 09:00:00 via INTRAVENOUS

## 2012-07-15 MED ORDER — MIDAZOLAM HCL 2 MG/2ML IJ SOLN
INTRAMUSCULAR | Status: AC
  Filled 2012-07-15: qty 2

## 2012-07-15 MED ORDER — CEFAZOLIN SODIUM-DEXTROSE 2-3 GM-% IV SOLR
2.0000 g | INTRAVENOUS | Status: DC
  Filled 2012-07-15: qty 50

## 2012-07-15 MED ORDER — DEXTROSE 5 % IV SOLN
3.0000 g | Freq: Four times a day (QID) | INTRAVENOUS | Status: AC
  Administered 2012-07-15 – 2012-07-16 (×3): 3 g via INTRAVENOUS
  Filled 2012-07-15 (×5): qty 3000

## 2012-07-15 MED ORDER — ACETAMINOPHEN 325 MG PO TABS
325.0000 mg | ORAL_TABLET | ORAL | Status: DC | PRN
  Filled 2012-07-15: qty 2

## 2012-07-15 MED ORDER — SODIUM CHLORIDE 0.9 % IJ SOLN: 3.0000 mL | INTRAMUSCULAR | Status: DC | PRN

## 2012-07-15 NOTE — Progress Notes (Signed)
07/15/2012 1340 Returned for Cath Lab s/p pacemaker. Pacing at 65. Left arm in sling and instructions regarding movement of that arm given. IV in Left hand removed. Ladamien Rammel, Linnell Fulling

## 2012-07-15 NOTE — Progress Notes (Signed)
Subjective:  See notes for history.  No SOB today.   Objective:  Vital Signs in the last 24 hours: BP 91/41  Pulse 41  Temp 97.6 F (36.4 C) (Oral)  Resp 17  Ht 5\' 9"  (1.753 m)  Wt 137 kg (302 lb 0.5 oz)  BMI 44.60 kg/m2  SpO2 98%  Physical Exam: Pleasant obese WM in NAD Lungs:  Clear  Cardiac:  Regular rhythm, normal S1 and S2, no S3 Extremities: Venous insuffciency  Intake/Output from previous day: 01/09 0701 - 01/10 0700 In: -  Out: 575 [Urine:575] Weight Filed Weights   07/14/12 2015 07/15/12 0500  Weight: 137.5 kg (303 lb 2.1 oz) 137 kg (302 lb 0.5 oz)    Lab Results: Basic Metabolic Panel:  Basename 07/15/12 0450 07/14/12 1710  NA 139 135  K 4.3 4.6  CL 105 102  CO2 25 18*  GLUCOSE 93 110*  BUN 25* 27*  CREATININE 1.31 1.11    CBC:  Basename 07/15/12 0450 07/14/12 1710  WBC 8.2 8.1  NEUTROABS -- --  HGB 11.1* 11.0*  HCT 34.1* 34.5*  MCV 92.4 91.5  PLT 216 220    Telemetry:  Sinus with intermittent Wenckebach  Assessment/Plan:   1. Sick sinus syndrome and AV block intermittent, conduction disease likely age related. I agree with placement of pacemaker, Dr. Lubertha Basque consult appreciated. Prefer Medtronic device.       Darden Palmer  MD [facility]  07/15/2012, 8:20 AM

## 2012-07-15 NOTE — Progress Notes (Signed)
07/15/2012 1140 to cath lab for pacer. Mandel Seiden, Linnell Fulling

## 2012-07-15 NOTE — Op Note (Signed)
DDD PPM insertion via the left subclavian vein without immediate complication. W#[id number].

## 2012-07-15 NOTE — Consult Note (Signed)
ELECTROPHYSIOLOGY CONSULT NOTE    Patient ID: Luis Carrillo MRN: [id number], DOB/AGE: 1931-06-29 79 y.o.  Admit date: 07/14/2012 Date of Consult: 07-15-2012  Primary Physician: Nonnie Done., MD Primary Cardiologist: Viann Fish, MD  Reason for Consultation: symptomatic bradycardia and syncope  HPI:  Luis Carrillo is a 79 year old male who was admitted yesterday following a syncopal event.  Telemetry has demonstrated intermittent Mobitz II, Mobitz I and bradycardia.  EP has been asked to evaluate.  His past medical history is significant for diabetes, obesity, COPD, hypertension, and chronic venous insufficiency.  He recently had a prolonged hospitalization for LLE cellulitis requiring IV antibiotics with a PICC line.    During his hospitalization for cellulitis, he had a transient period of CHB.  He was on Metoprolol at the time and this was discontinued.  He was not symptomatic.   When ambulating in the house day before yesterday, he had a syncopal episode.  He was walking, felt fatigued, and then woke up on the floor.   The patient is on no AV nodal blocking agents at home.  Electrolytes have been normal this admission.  Echocardiogram obtained during last hospitalization demonstrated an EF of 60-65% with no RWMA.    He endorses some shortness of breath, denies chest pain or palpitations.    ROS is negative except as outlined above.   Past Medical History  Diagnosis Date  . Diabetes mellitus without complication   . COPD (chronic obstructive pulmonary disease)   . Hypertension      Surgical History: History reviewed. No pertinent past surgical history.   Prescriptions prior to admission  Medication Sig Dispense Refill  . albuterol (PROVENTIL HFA;VENTOLIN HFA) 108 (90 BASE) MCG/ACT inhaler Inhale 4 puffs into the lungs every 2 (two) hours as needed for wheezing.      Marland Kitchen amLODipine (NORVASC) 5 MG tablet Take 5 mg by mouth daily.      . furosemide (LASIX) 20 MG tablet Take 20  mg by mouth every Monday, Wednesday, and Friday.      Marland Kitchen glipiZIDE (GLUCOTROL XL) 10 MG 24 hr tablet Take 10 mg by mouth 2 (two) times daily.      . meloxicam (MOBIC) 15 MG tablet Take 15 mg by mouth daily.      . metFORMIN (GLUCOPHAGE) 500 MG tablet Take 500 mg by mouth 2 (two) times daily with a meal.      . Multiple Vitamin (MULTIVITAMIN WITH MINERALS) TABS Take 1 tablet by mouth daily.      Marland Kitchen oxyCODONE-acetaminophen (PERCOCET/ROXICET) 5-325 MG per tablet Take 1 tablet by mouth every 4 (four) hours as needed. For pain      . potassium chloride (K-DUR,KLOR-CON) 10 MEQ tablet Take 10 mEq by mouth 2 (two) times daily.      . valsartan (DIOVAN) 160 MG tablet Take 160 mg by mouth daily.      . vitamin E 400 UNIT capsule Take 400 Units by mouth daily.      Marland Kitchen zolpidem (AMBIEN) 5 MG tablet Take 1 tablet (5 mg total) by mouth at bedtime as needed for sleep.  30 tablet  0    Inpatient Medications:    . amLODipine  5 mg Oral Daily  . docusate sodium  100 mg Oral BID  . furosemide  20 mg Oral Q M,W,F  . glipiZIDE  10 mg Oral BID WC  . insulin aspart  0-20 Units Subcutaneous TID WC  . insulin aspart  0-5 Units Subcutaneous QHS  .  irbesartan  150 mg Oral Daily  . meloxicam  15 mg Oral Daily  . potassium chloride  10 mEq Oral BID  . senna  1 tablet Oral BID  . sodium chloride  3 mL Intravenous Q12H    Allergies:  Allergies  Allergen Reactions  . Alka-Seltzer Heartburn (Sodium Bicarbonate-Citric Acid)   . Citric Acid     All the ingredients in alka seltzer gold  . Potassium Bicarbonate     All the ingredients in alka seltzer gold  . Sodium Bicarbonate     All the ingredients in alka seltzer gold    History   Social History  . Marital Status: Married    Spouse Name: N/A    Number of Children: N/A  . Years of Education: N/A   Occupational History  . Not on file.   Social History Main Topics  . Smoking status: Former Smoker -- 1.0 packs/day for 20 years    Types: Cigarettes     Quit date: 05/24/1969  . Smokeless tobacco: Never Used  . Alcohol Use:   . Drug Use:   . Sexually Active:    Other Topics Concern  . 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      History reviewed. No pertinent family history.   Physical exam Well appearing elderly man, NAD HEENT: Unremarkable Neck:  No JVD, no thyromegally Lungs:  Clear with no wheezes HEART:  Regular rate rhythm, no murmurs, no rubs, no clicks Abd:  soft, positive bowel sounds, no organomegally, no rebound, no guarding Ext:  2 plus pulses, no edema, no cyanosis, no clubbing Skin:  No rashes no nodules Neuro:  CN II through XII intact, motor grossly intact   Labs:   Lab Results  Component Value Date   WBC 8.2 07/15/2012   HGB 11.1* 07/15/2012   HCT 34.1* 07/15/2012   MCV 92.4 07/15/2012   PLT 216 07/15/2012    Lab 07/15/12 0450  NA 139  K 4.3  CL 105  CO2 25  BUN 25*  CREATININE 1.31  CALCIUM 8.9  PROT --  BILITOT --  ALKPHOS --  ALT --  AST --  GLUCOSE 93   Lab Results  Component Value Date   TROPONINI <0.30 07/14/2012     Radiology/Studies: Dg Chest Portable 1 View 07/14/2012  *RADIOLOGY REPORT*  Clinical Data: Near-syncope.  PORTABLE CHEST - 1 VIEW  Comparison: 05/26/2012.  Findings: Trachea is midline.  Heart size is grossly stable. Defibrillator pads project over the left chest.  Lungs are grossly clear.  No right pleural effusion.  Left costophrenic angle is not included on the image.  IMPRESSION: No definite acute findings.   Original Report Authenticated By: Leanna Battles, M.D.    EKG:  07-15-2012 6:55AM- sinus rhythm with intermittent Mobitz I, RBBB  05-29-2012- Mobitz II, RBBB  TELEMETRY: sinus brady, intermittent Mobitz II  A/P  1. Syncope 2. intermittant heart block 3. HTN Rec: I have discussed the risks/benefits/goals/expectations of insertion of PPM and he wishes to  proceed.  Leonia Reeves.D.

## 2012-07-15 NOTE — Progress Notes (Signed)
UR Completed.  Luis Carrillo Jane [phone] 07/15/2012  

## 2012-07-16 ENCOUNTER — Inpatient Hospital Stay (HOSPITAL_COMMUNITY): Payer: Medicare Other

## 2012-07-16 LAB — GLUCOSE, CAPILLARY: Glucose-Capillary: 89 mg/dL (ref 70–99)

## 2012-07-16 NOTE — Progress Notes (Signed)
Notified Dr. Mayford Knife of pt's daughter's concern r/t pt using walker at home with restricted activity to LUE.  New order given to hold off on DC and to order PT eval.

## 2012-07-16 NOTE — Progress Notes (Signed)
SUBJECTIVE:  Doing well no complaints  OBJECTIVE:   Vitals:   Filed Vitals:   07/15/12 2352 07/16/12 0345 07/16/12 0500 07/16/12 0819  BP: 136/59 150/50    Pulse: 66 67    Temp: 98.4 F (36.9 C) 97.9 F (36.6 C)  97.3 F (36.3 C)  TempSrc: Oral Oral  Oral  Resp: 20 20  20   Height:      Weight:   135 kg (297 lb 9.9 oz)   SpO2: 97% 98%     I&O's:   Intake/Output Summary (Last 24 hours) at 07/16/12 1135 Last data filed at 07/16/12 0500  Gross per 24 hour  Intake    820 ml  Output   2125 ml  Net  -1305 ml   TELEMETRY: Reviewed telemetry pt in NSR with Vpacing     PHYSICAL EXAM General: Well developed, well nourished, in no acute distress Head: Eyes PERRLA, No xanthomas.   Normal cephalic and atramatic  Lungs:   Clear bilaterally to auscultation and percussion.  Pacer site clean, dry and intact.  No hematoma or ecchymosis. Heart:   HRRR S1 S2 Pulses are 2+ & equal. Abdomen: Bowel sounds are positive, abdomen soft and non-tender without masses Extremities:   No clubbing, cyanosis or edema.  DP +1 Neuro: Alert and oriented X 3. Psych:  Good affect, responds appropriately   LABS: Basic Metabolic Panel:  Basename 07/15/12 0450 07/14/12 1710  NA 139 135  K 4.3 4.6  CL 105 102  CO2 25 18*  GLUCOSE 93 110*  BUN 25* 27*  CREATININE 1.31 1.11  CALCIUM 8.9 9.2  MG -- 1.9  PHOS -- --   Liver Function Tests: No results found for this basename: AST:2,ALT:2,ALKPHOS:2,BILITOT:2,PROT:2,ALBUMIN:2 in the last 72 hours No results found for this basename: LIPASE:2,AMYLASE:2 in the last 72 hours CBC:  Basename 07/15/12 0450 07/14/12 1710  WBC 8.2 8.1  NEUTROABS -- --  HGB 11.1* 11.0*  HCT 34.1* 34.5*  MCV 92.4 91.5  PLT 216 220   Cardiac Enzymes:  Basename 07/14/12 1711  CKTOTAL --  CKMB --  CKMBINDEX --  TROPONINI <0.30   Coag Panel:   Lab Results  Component Value Date   INR 1.18 07/15/2012   INR 1.21 05/22/2012    RADIOLOGY: Dg Chest 2 View  07/16/2012   *RADIOLOGY REPORT*  Clinical Data: Pacemaker insertion.  Fatigue.  CHEST - 2 VIEW  Comparison: 07/14/2012.  Findings: New dual lead left subclavian cardiac pacemaker.  The leads appear present over the right atrial appendage and right ventricular apex.  Subsegmental atelectasis in the perihilar region on the right.  Mild left basilar atelectasis.  No pneumothorax. Cardiopericardial silhouette is borderline for size.  IMPRESSION: Uncomplicated left subclavian cardiac pacemaker placement. Atelectasis.   Original Report Authenticated By: Andreas Newport, M.D.    Mr Abdomen W Wo Contrast  07/07/2012  *RADIOLOGY REPORT*  Clinical Data: The left adrenal nodule and question of pancreatic tail mass on recent CT scan.  MRI ABDOMEN WITH AND WITHOUT CONTRAST  Technique:  Multiplanar multisequence MR imaging of the abdomen was performed both before and after administration of intravenous contrast.  Contrast: 20mL MULTIHANCE GADOBENATE DIMEGLUMINE 529 MG/ML IV SOLN  Comparison: CT scan from 06/17/2012  Findings: Image quality is degraded by body habitus and the patient's difficulty with reproducible breath-holding.  A tiny hepatic cyst is identified in the medial liver dome.  10 mm enhancing lesion in the anterior spleen is likely benign.  The stomach, duodenum, gallbladder, and right  adrenal gland are normal.  2.3 x 1.7 cm left adrenal nodule shows clear loss of signal on out of phase imaging when compared to the and phase T1- weighted imaging.  As such, imaging characteristics are entirely consistent with benign adrenal adenoma.  Tiny cortical cysts are seen in the right kidney.  Left kidney is unremarkable.  2.0 x 1.3 cm cystic structure is identified in the tail the pancreas, accounting for the finding on the recent CT scan.  Other scattered very tiny 3-5 mm cysts are seen scattered throughout the pancreatic parenchyma.  There is no dilatation of the main pancreatic duct.  No peripancreatic lymphadenopathy.  The portal  vein, superior mesenteric vein, and splenic vein are patent.  2.1 x 2.6 cm T2 hyperintense lesion in the right aspect of the L1 vertebral body appears to have intermediate signal on T1-weighted imaging and shows evidence for enhancement after IV contrast administration.  No evidence for lymphadenopathy or free fluid in the abdomen.  No bowel obstruction.  IMPRESSION: The lesion in the tail the pancreas represents a 2.0 x 1.23 cm simple and elongated cystic structure with no internal septation or mural thickening/irregularity.  Other scattered very tiny cystic foci are seen peppered through the pancreatic parenchyma.  The dominant tail lesion is most likely a benign finding.  Follow-up MRI in 6 months could be used to further evaluate This recommendation follows ACR consensus guidelines:  Managing Incidental Findings on Abdominal CT:  White Paper of the ACR Incidental Findings Committee.  J Am Coll Radiol 2010;7:754-773 .  2.3 cm left adrenal nodule represents a benign adrenal adenoma.  Probable hemangioma in the L1 vertebral body.  This could also be reassessed at the time of follow-up MRI.  1 cm indeterminate enhancing focus in the anterior spleen could be reassessed for stability at the time of follow-up.   Original Report Authenticated By: Kennith Center, M.D.    Ct Angio Ao+bifem W/cm &/or Wo/cm  06/17/2012  *RADIOLOGY REPORT*  Clinical Data:  Left leg swelling, edema and drainage.  CT ANGIOGRAPHY OF ABDOMINAL AORTA WITH ILIOFEMORAL RUNOFF  Technique:  Multidetector CT imaging of the abdomen, pelvis and lower extremities was performed using the standard protocol during bolus administration of intravenous contrast.  Multiplanar CT image reconstructions including MIPs were obtained to evaluate the vascular anatomy.  Contrast: OMNIPAQUE IOHEXOL 350 MG/ML SOLN  Comparison:  MRI of the left lower leg on 05/24/2012  Findings:  Aorta:  Images of the abdominal aorta are markedly limited due to the patient's body  habitus even after repeat imaging.  There appears to be flow within the celiac trunk and superior mesenteric artery.  Limited evaluation of the renal arteries and inferior mesenteric artery.  The distal abdominal aorta is very difficult to evaluate.  There appears to be flow within the common, internal and external iliac arteries bilaterally.  Right Lower Extremity:  A large amount of venous contamination in the lower legs.  The common femoral artery, profunda femoral artery and superficial femoral artery are patent on the right side.  The right popliteal artery is patent.  There may be segmental disease in the right peroneal artery.  Posterior tibial artery appears to be patent but difficult to evaluate.  There is diffuse subcutaneous edema.  The tibial veins are patent.  Popliteal vein is patent. Right femoral vein also appears to patent.  No evidence for a right lower extremity DVT.  Left Lower Extremity:  Left common femoral artery, superficial femoral artery and deep  femoral artery are patent on the left. There is a 1.3 cm node or possible varix near the adductor canal on the left side.  The left popliteal artery is patent.  The left anterior and posterior tibial arteries appear to be patent on the left side.  Limited evaluation of the dorsalis pedis artery.  The proximal peroneal artery appears to be patent.  The tibial veins, popliteal vein and left femoral vein appear to be patent.  No evidence for left lower extremity DVT.  There is extensive subcutaneous edema in the left lower extremity.  There is bilateral knee joint fluid.  Nonvascular structures:  There is fluid within the scrotum, right side greater than left.  Findings are suggestive for hydroceles. There is volume loss in the lower lung lobes, left side greater than right.  No evidence for free intraperitoneal air.  Evaluation of the solid organs is limited due to the arterial phase contrast and patient's body habitus.  No gross abnormality to the  liver, gallbladder, spleen and right adrenal gland.  There is a 2.9 cm low density nodule involving the medial limb of the left adrenal gland. This is indeterminate on this postcontrast examination.  No gross abnormality to the kidneys.  The tail of the pancreas is nodular and there is concern for a lesion or mass in this location.  This area roughly measures 3.2 x 2.6 cm.  This is best seen on sequence #10, image 73.  There are small lymph nodes throughout the pelvis which are nonspecific.  No significant free fluid.  There is a ventral hernia containing fat.  Multilevel degenerative changes in the spine.   Review of the MIP images confirms the above findings.  IMPRESSION: Limited examination due to the patient's body habitus.  Limited evaluation of the abdominal aorta but no significant outflow or runoff disease.  There is no evidence for deep vein thrombosis in the lower legs.  There is subcutaneous edema in both lower legs, left side greater than right.  There is concern for a pancreatic tail lesion and a neoplastic process cannot be excluded.  There is also a nodule involving the left adrenal gland which is indeterminate.   Due to the patient's body habitus, additional CT imaging in this area may not be helpful.  Consider further evaluation of the pancreas and left adrenal gland with an open magnet MRI.  These results will be called to the ordering clinician or representative by the Radiologist Assistant, and communication documented in the PACS Dashboard.   Original Report Authenticated By: Richarda Overlie, M.D.    Dg Chest Portable 1 View  07/14/2012  *RADIOLOGY REPORT*  Clinical Data: Near-syncope.  PORTABLE CHEST - 1 VIEW  Comparison: 05/26/2012.  Findings: Trachea is midline.  Heart size is grossly stable. Defibrillator pads project over the left chest.  Lungs are grossly clear.  No right pleural effusion.  Left costophrenic angle is not included on the image.  IMPRESSION: No definite acute findings.    Original Report Authenticated By: Leanna Battles, M.D.       ASSESSMENT:  1.  Sick sinus syndrome with intermittent heart block Mobitz I and II s/p PPM 2.  Syncope secondary to #1 3.  HTN 4.  DM  PLAN:   1.  Transfer to tele bed 2.  Will hold on discharge today - wife is concerned about patient being able to ambulate at home.  He uses a walker and is concerned he will not be able to get around after  PPM placed.  Will consult PT.  Quintella Reichert, MD  07/16/2012  11:35 AM

## 2012-07-16 NOTE — Op Note (Signed)
NAME:  Luis Carrillo, Luis Carrillo NO.:  [id number]  MEDICAL RECORD NO.:  [id number]  LOCATION:  2915                         FACILITY:  [facility]  PHYSICIAN:  Doylene Canning. Ladona Ridgel, MD    DATE OF BIRTH:  08/24/1930  DATE OF PROCEDURE:  07/15/2012 DATE OF DISCHARGE:                              OPERATIVE REPORT   PROCEDURE PERFORMED:  Insertion of a dual-chamber pacemaker.  INDICATION:  Symptomatic 2:1 heart block with syncope.  INTRODUCTION:  The patient is a 79 year old man who has had syncopal episodes in the past, who was admitted to the hospital with symptomatic bradycardia and was found to be in 2:1 heart block.  He has baseline right bundle-branch block.  He is now referred for permanent dual- chamber pacemaker insertion as he is on no AV nodal blocking drugs.  PROCEDURE:  After informed consent was obtained, the patient was taken to the Diagnostic EP Lab in the fasting state.  After usual preparation and draping, intravenous fentanyl and midazolam was given for sedation. A 30 mL of lidocaine was infiltrated into the left infraclavicular region.  A 5-cm incision was carried out over this region and electrocautery was utilized to dissect down to the fascial plane.  The left subclavian vein was then punctured x2 and the Medtronic model 5076 58-cm active fixation pacing lead, serial number [id number] being advanced into the right ventricle.  The Medtronic model 5076 52-cm active fixation pacing lead, serial number [id number] was advanced to the right atrium.  With mapping was carried out in the right ventricle at the final site, the R-waves measured 10 millivolts.  The pacing impedance was initially 1400 ohms.  Once the lead was actively fixed, there was large injury current, the threshold 0.3 volts at 0.5 milliseconds.  The 10-volt pacing did not stimulate the diaphragm.  With the ventricular lead in satisfactory position, attention was then turned to the atrial lead, which  was placed in the anterolateral portion of the right atrium where P-waves measured 3 millivolts.  The pacing impedance was 600 ohms, the threshold was initially 1.5 volts at 0.5 milliseconds. Again, there was a large injury current with active fixation in the lead and 10-volt pacing did not stimulate the diaphragm.  With both the atrial and ventricular leads in satisfactory position, they were secured to the subpectoral fascia with a figure-of-eight silk suture.  The sewing sleeve was secured with silk suture.  Electrocautery was utilized to make a subcutaneous pocket.  Antibiotic irrigation was utilized to irrigate the pocket and electrocautery was utilized to assure hemostasis.  The Medtronic Adapta L dual-chamber pacemaker, serial number [id number] was connected to the atrial and the RV leads and placed back in the subcutaneous pocket.  The pocket was irrigated with additional antibiotic irrigation, and the incision was closed with 2-0 and 3-0 Vicryl.  Benzoin and Steri-Strips were painted on the skin, a pressure dressing was applied and the patient was returned to his room in satisfactory condition.  COMPLICATIONS:  There were no immediate procedure complications.  RESULTS:  This demonstrates successful implantation of a Medtronic dual- chamber pacemaker in a patient with symptomatic 2:1 heart block and history of syncope.  Doylene Canning. Ladona Ridgel, MD     GWT/MEDQ  D:  07/15/2012  T:  07/16/2012  Job:  [id number]  cc:   Georga Hacking, M.D.

## 2012-07-17 NOTE — Progress Notes (Signed)
NCM spoke to pt and states he was active with [facility]. NCM faxed resumption of care orders to [facility] for scheduled d/c home today.  Notified Byrd Hesselbach, RN on call that pt was d/c home today. Isidoro Donning RN CCM Case Mgmt phone [phone]

## 2012-07-17 NOTE — Evaluation (Signed)
Physical Therapy Evaluation Patient Details Name: Luis Carrillo MRN: [id number] DOB: August 22, 1930 Today's Date: 07/17/2012 Time: 5784-6962 PT Time Calculation (min): 59 min  PT Assessment / Plan / Recommendation Clinical Impression  Patient is a 79 yo male admitted with bradycardia, s/p pacemaker.  Patient used RW pta.  MD reports OK to remove sling to use LUE on RW for balance/safety, minimizing weight on LUE.  Patient able to do this safely.  Instructed patient on and practiced donning/doffing sling. Educated patient to have sling on at all times except when ambulating with RW - no use of LUE otherwise.  Instructed patient and answered questions regarding car transfers, stairs, safety when getting meals, use of walker bag, bathing.  Family will be assisting patient daily.  Recommend HHPT continue services at discharge.    PT Assessment  All further PT needs can be met in the next venue of care    Follow Up Recommendations  Home health PT;Supervision - Intermittent    Does the patient have the potential to tolerate intense rehabilitation      Barriers to Discharge        Equipment Recommendations  None recommended by PT    Recommendations for Other Services     Frequency      Precautions / Restrictions Precautions Precautions: ICD/Pacemaker Required Braces or Orthoses: Other Brace/Splint (Sling LUE) Other Brace/Splint: Sling LUE Restrictions Weight Bearing Restrictions: No Other Position/Activity Restrictions: Minimize weight through LUE   Pertinent Vitals/Pain       Mobility  Bed Mobility Bed Mobility: Not assessed Details for Bed Mobility Assistance: Patient reports he will be sleeping in recliner until he has his first f/u MD visit. Transfers Transfers: Sit to Stand;Stand to Sit Sit to Stand: 5: Supervision;With upper extremity assist;With armrests;From chair/3-in-1 (Using RUE only) Stand to Sit: 5: Supervision;With upper extremity assist;With armrests;To chair/3-in-1  (Using RUE only) Details for Transfer Assistance: Instructed patient on sit<> stand technique using RUE only.  Instructed him to keep LUE by his side.  Patient able to perform correctly x2. Ambulation/Gait Ambulation/Gait Assistance: 5: Supervision Ambulation Distance (Feet): 32 Feet Assistive device: Rolling walker Ambulation/Gait Assistance Details: Verbal cues to use LUE for balance only.  Minimize weight on LUE. Patient able to use RW safely using RUE to maneuver and LUE for balance only. Gait Pattern: Step-through pattern;Decreased stride length;Trunk flexed Gait velocity: Slow gait speed           PT Diagnosis: Abnormality of gait;Acute pain  PT Problem List: Decreased strength;Decreased range of motion;Decreased activity tolerance;Decreased balance;Decreased mobility;Cardiopulmonary status limiting activity;Obesity PT Treatment Interventions:   N/A  PT Goals  N/A  Visit Information  Last PT Received On: 07/17/12 Assistance Needed: +1    Subjective Data  Subjective: Patient very talkative with lots of questions.  "I hope I can go home today" Patient Stated Goal: To return home   Prior Functioning  Home Living Lives With: Alone Available Help at Discharge: Family;Available PRN/intermittently Type of Home: House Home Access: Stairs to enter [employer] of Steps: 2 Entrance Stairs-Rails: Right;Left Home Layout: One level Firefighter: Standard Bathroom Accessibility: Yes How Accessible: Accessible via walker Home Adaptive Equipment: Walker - rolling Prior Function Level of Independence: Independent with assistive device(s);Needs assistance Needs Assistance: Light Housekeeping;Meal Prep Meal Prep: Moderate Light Housekeeping: Maximal Able to Take Stairs?: Yes Driving: Yes Vocation: Retired Musician: No difficulties Dominant Hand: Right    Cognition  Overall Cognitive Status: Appears within functional limits for tasks  assessed/performed Arousal/Alertness: Awake/alert Orientation  Level: Oriented X4 / Intact Behavior During Session: [facility] for tasks performed    Extremity/Trunk Assessment Right Upper Extremity Assessment RUE ROM/Strength/Tone: Within functional levels RUE Sensation: WFL - Light Touch Left Upper Extremity Assessment LUE ROM/Strength/Tone: Deficits;Unable to fully assess;Due to precautions;Due to pain LUE ROM/Strength/Tone Deficits: Sling in place LUE Sensation: WFL - Light Touch Right Lower Extremity Assessment RLE ROM/Strength/Tone: WFL for tasks assessed RLE Sensation: WFL - Light Touch Left Lower Extremity Assessment LLE ROM/Strength/Tone: WFL for tasks assessed LLE Sensation: WFL - Light Touch   Balance    End of Session PT - End of Session Equipment Utilized During Treatment: Gait belt (Sling LUE) Activity Tolerance: Patient tolerated treatment well Patient left: in chair;with call bell/phone within reach Nurse Communication: Mobility status (Discharge needs)  GP     Vena Austria 07/17/2012, 11:00 AM Durenda Hurt. Renaldo Fiddler, [facility] Acute Rehab Services Pager [phone]

## 2012-07-17 NOTE — Discharge Summary (Signed)
Patient ID: Brentton Wardlow MRN: [id number] DOB/AGE: 1930/09/15 78 y.o.  Admit date: 07/14/2012 Discharge date: 07/17/2012  Primary Discharge Diagnosis  Syncope secondary to intermittent Mobitz I and II second degree AV block s/p PPM Secondary Discharge Diagnosis  HTN  COPD  Chronic venous insufficiency  LLE cellulitis   DM   Consults: cardiology EP Dr. Renee Rival Course: Mr. Ayala is a 79 year old male who was admitted  following a syncopal event. Telemetry demonstrated intermittent Mobitz II, Mobitz I and bradycardia.His past medical history is significant for diabetes, obesity, COPD, hypertension, and chronic venous insufficiency. He recently had a prolonged hospitalization for LLE cellulitis requiring IV antibiotics with a PICC line. During his hospitalization for cellulitis, he had a transient period of CHB. He was on Metoprolol at the time and this was discontinued. He was not symptomatic.  When ambulating in the house day before admission he had a syncopal episode. He was walking, felt fatigued, and then woke up on the floor. The patient was on no AV nodal blocking agents at home.Echocardiogram obtained during last hospitalization demonstrated an EF of 60-65% with no RWMA. He underwent PPM placement by Dr. Ladona Ridgel and did well post op.  A PT consult was obtained post pacer to get a walker that would enable him to ambulate better post pacer than the walker he has at home.  He did well ambulating in the hall without difficulty. His pacer site is dry, intact with no hematoma or ecchymosis.  He was discharged to home in stable condition and activity and wound care discussed with patient.      Discharge Exam: Blood pressure 132/73, pulse 69, temperature 98 F (36.7 C), temperature source Oral, resp. rate 18, height 5\' 9"  (1.753 m), weight 141 kg (310 lb 13.6 oz), SpO2 93.00%.   General appearance: alert Resp: clear to auscultation bilaterally Cardio: regular rate and rhythm, S1, S2  normal, no murmur, click, rub or gallop GI: soft, non-tender; bowel sounds normal; no masses,  no organomegaly Extremities: extremities normal, atraumatic, no cyanosis or edema Labs:   Lab Results  Component Value Date   WBC 8.2 07/15/2012   HGB 11.1* 07/15/2012   HCT 34.1* 07/15/2012   MCV 92.4 07/15/2012   PLT 216 07/15/2012    Lab 07/15/12 0450  NA 139  K 4.3  CL 105  CO2 25  BUN 25*  CREATININE 1.31  CALCIUM 8.9  PROT --  BILITOT --  ALKPHOS --  ALT --  AST --  GLUCOSE 93   Lab Results  Component Value Date   TROPONINI <0.30 07/14/2012        Radiology:  *RADIOLOGY REPORT*  Clinical Data: Pacemaker insertion. Fatigue.  CHEST - 2 VIEW  Comparison: 07/14/2012.  Findings: New dual lead left subclavian cardiac pacemaker. The  leads appear present over the right atrial appendage and right  ventricular apex. Subsegmental atelectasis in the perihilar region  on the right. Mild left basilar atelectasis. No pneumothorax.  Cardiopericardial silhouette is borderline for size.  IMPRESSION:  Uncomplicated left subclavian cardiac pacemaker placement.  Atelectasis.  Original Report Authenticated By: Andreas Newport, M.D.  EKG:NSR with V pacing  FOLLOW UP PLANS AND APPOINTMENTS Discharge Orders    Future Orders Please Complete By Expires   Diet - low sodium heart healthy      Diet - low sodium heart healthy      Increase activity slowly      Driving Restrictions      Comments:  No driving until seen by Dr. Ladona Ridgel   Increase activity slowly          Medication List     As of 07/17/2012 11:05 AM    TAKE these medications         albuterol 108 (90 BASE) MCG/ACT inhaler   Commonly known as: PROVENTIL HFA;VENTOLIN HFA   Inhale 4 puffs into the lungs every 2 (two) hours as needed for wheezing.      amLODipine 5 MG tablet   Commonly known as: NORVASC   Take 5 mg by mouth daily.      furosemide 20 MG tablet   Commonly known as: LASIX   Take 20 mg by mouth every  Monday, Wednesday, and Friday.      glipiZIDE 10 MG 24 hr tablet   Commonly known as: GLUCOTROL XL   Take 10 mg by mouth 2 (two) times daily.      meloxicam 15 MG tablet   Commonly known as: MOBIC   Take 15 mg by mouth daily.      metFORMIN 500 MG tablet   Commonly known as: GLUCOPHAGE   Take 500 mg by mouth 2 (two) times daily with a meal.      multivitamin with minerals Tabs   Take 1 tablet by mouth daily.      oxyCODONE-acetaminophen 5-325 MG per tablet   Commonly known as: PERCOCET/ROXICET   Take 1 tablet by mouth every 4 (four) hours as needed. For pain      potassium chloride 10 MEQ tablet   Commonly known as: K-DUR,KLOR-CON   Take 10 mEq by mouth 2 (two) times daily.      valsartan 160 MG tablet   Commonly known as: DIOVAN   Take 160 mg by mouth daily.      vitamin E 400 UNIT capsule   Take 400 Units by mouth daily.      zolpidem 5 MG tablet   Commonly known as: AMBIEN   Take 1 tablet (5 mg total) by mouth at bedtime as needed for sleep.           Follow-up Information    Call Darden Palmer, MD. (call for an appointment with Dr. Donnie Aho in 2 weeks)    Contact information:   [address] [place] [address] [phone]       Follow up with [facility]. In 1 week. (Device/Wound check; Our office will call you with your appointment time)    Contact information:   [address] [place] [address] [phone]      Follow up with Lewayne Bunting, MD. In 3 months. (Our office will call you with your appointment time)    Contact information:   [facility] [address] [place] [address] [phone]         BRING ALL MEDICATIONS WITH YOU TO FOLLOW UP APPOINTMENTS  Time spent with patient to include physician time:35 minutes Signed: Quintella Reichert 07/17/2012, 11:05 AM

## 2012-07-17 NOTE — Progress Notes (Addendum)
SUBJECTIVE:  No complaints  OBJECTIVE:   Vitals:   Filed Vitals:   07/16/12 1215 07/16/12 1605 07/16/12 2100 07/17/12 0500  BP:  138/72 127/72 132/73  Pulse:  74 77 69  Temp: 99 F (37.2 C) 98.2 F (36.8 C) 98.9 F (37.2 C) 98 F (36.7 C)  TempSrc: Oral Oral Oral Oral  Resp:  20 18 18   Height:  5\' 9"  (1.753 m)    Weight:  140.57 kg (309 lb 14.4 oz)  141 kg (310 lb 13.6 oz)  SpO2: 96% 91% 93% 93%   I&O's:   Intake/Output Summary (Last 24 hours) at 07/17/12 9562 Last data filed at 07/17/12 0032  Gross per 24 hour  Intake    420 ml  Output   1050 ml  Net   -630 ml   TELEMETRY: Reviewed telemetry pt in NSR with V pacing     PHYSICAL EXAM General: Well developed, well nourished, in no acute distress Head: Eyes PERRLA, No xanthomas.   Normal cephalic and atramatic  Lungs:   Clear bilaterally to auscultation and percussion. Heart:   HRRR S1 S2 Pulses are 2+ & equal. Abdomen: Bowel sounds are positive, abdomen soft and non-tender without masses  Extremities:   No clubbing, cyanosis or edema.  DP +1 Neuro: Alert and oriented X 3. Psych:  Good affect, responds appropriately   LABS: Basic Metabolic Panel:  Basename 07/15/12 0450 07/14/12 1710  NA 139 135  K 4.3 4.6  CL 105 102  CO2 25 18*  GLUCOSE 93 110*  BUN 25* 27*  CREATININE 1.31 1.11  CALCIUM 8.9 9.2  MG -- 1.9  PHOS -- --   Liver Function Tests: No results found for this basename: AST:2,ALT:2,ALKPHOS:2,BILITOT:2,PROT:2,ALBUMIN:2 in the last 72 hours No results found for this basename: LIPASE:2,AMYLASE:2 in the last 72 hours CBC:  Basename 07/15/12 0450 07/14/12 1710  WBC 8.2 8.1  NEUTROABS -- --  HGB 11.1* 11.0*  HCT 34.1* 34.5*  MCV 92.4 91.5  PLT 216 220   Cardiac Enzymes:  Basename 07/14/12 1711  CKTOTAL --  CKMB --  CKMBINDEX --  TROPONINI <0.30   Coag Panel:   Lab Results  Component Value Date   INR 1.18 07/15/2012   INR 1.21 05/22/2012    RADIOLOGY: Dg Chest 2 View  07/16/2012   *RADIOLOGY REPORT*  Clinical Data: Pacemaker insertion.  Fatigue.  CHEST - 2 VIEW  Comparison: 07/14/2012.  Findings: New dual lead left subclavian cardiac pacemaker.  The leads appear present over the right atrial appendage and right ventricular apex.  Subsegmental atelectasis in the perihilar region on the right.  Mild left basilar atelectasis.  No pneumothorax. Cardiopericardial silhouette is borderline for size.  IMPRESSION: Uncomplicated left subclavian cardiac pacemaker placement. Atelectasis.   Original Report Authenticated By: Andreas Newport, M.D.    Mr Abdomen W Wo Contrast  07/07/2012  *RADIOLOGY REPORT*  Clinical Data: The left adrenal nodule and question of pancreatic tail mass on recent CT scan.  MRI ABDOMEN WITH AND WITHOUT CONTRAST  Technique:  Multiplanar multisequence MR imaging of the abdomen was performed both before and after administration of intravenous contrast.  Contrast: 20mL MULTIHANCE GADOBENATE DIMEGLUMINE 529 MG/ML IV SOLN  Comparison: CT scan from 06/17/2012  Findings: Image quality is degraded by body habitus and the patient's difficulty with reproducible breath-holding.  A tiny hepatic cyst is identified in the medial liver dome.  10 mm enhancing lesion in the anterior spleen is likely benign.  The stomach, duodenum, gallbladder, and right  adrenal gland are normal.  2.3 x 1.7 cm left adrenal nodule shows clear loss of signal on out of phase imaging when compared to the and phase T1- weighted imaging.  As such, imaging characteristics are entirely consistent with benign adrenal adenoma.  Tiny cortical cysts are seen in the right kidney.  Left kidney is unremarkable.  2.0 x 1.3 cm cystic structure is identified in the tail the pancreas, accounting for the finding on the recent CT scan.  Other scattered very tiny 3-5 mm cysts are seen scattered throughout the pancreatic parenchyma.  There is no dilatation of the main pancreatic duct.  No peripancreatic lymphadenopathy.  The portal  vein, superior mesenteric vein, and splenic vein are patent.  2.1 x 2.6 cm T2 hyperintense lesion in the right aspect of the L1 vertebral body appears to have intermediate signal on T1-weighted imaging and shows evidence for enhancement after IV contrast administration.  No evidence for lymphadenopathy or free fluid in the abdomen.  No bowel obstruction.  IMPRESSION: The lesion in the tail the pancreas represents a 2.0 x 1.23 cm simple and elongated cystic structure with no internal septation or mural thickening/irregularity.  Other scattered very tiny cystic foci are seen peppered through the pancreatic parenchyma.  The dominant tail lesion is most likely a benign finding.  Follow-up MRI in 6 months could be used to further evaluate This recommendation follows ACR consensus guidelines:  Managing Incidental Findings on Abdominal CT:  White Paper of the ACR Incidental Findings Committee.  J Am Coll Radiol 2010;7:754-773 .  2.3 cm left adrenal nodule represents a benign adrenal adenoma.  Probable hemangioma in the L1 vertebral body.  This could also be reassessed at the time of follow-up MRI.  1 cm indeterminate enhancing focus in the anterior spleen could be reassessed for stability at the time of follow-up.   Original Report Authenticated By: Kennith Center, M.D.    Ct Angio Ao+bifem W/cm &/or Wo/cm  06/17/2012  *RADIOLOGY REPORT*  Clinical Data:  Left leg swelling, edema and drainage.  CT ANGIOGRAPHY OF ABDOMINAL AORTA WITH ILIOFEMORAL RUNOFF  Technique:  Multidetector CT imaging of the abdomen, pelvis and lower extremities was performed using the standard protocol during bolus administration of intravenous contrast.  Multiplanar CT image reconstructions including MIPs were obtained to evaluate the vascular anatomy.  Contrast: OMNIPAQUE IOHEXOL 350 MG/ML SOLN  Comparison:  MRI of the left lower leg on 05/24/2012  Findings:  Aorta:  Images of the abdominal aorta are markedly limited due to the patient's body  habitus even after repeat imaging.  There appears to be flow within the celiac trunk and superior mesenteric artery.  Limited evaluation of the renal arteries and inferior mesenteric artery.  The distal abdominal aorta is very difficult to evaluate.  There appears to be flow within the common, internal and external iliac arteries bilaterally.  Right Lower Extremity:  A large amount of venous contamination in the lower legs.  The common femoral artery, profunda femoral artery and superficial femoral artery are patent on the right side.  The right popliteal artery is patent.  There may be segmental disease in the right peroneal artery.  Posterior tibial artery appears to be patent but difficult to evaluate.  There is diffuse subcutaneous edema.  The tibial veins are patent.  Popliteal vein is patent. Right femoral vein also appears to patent.  No evidence for a right lower extremity DVT.  Left Lower Extremity:  Left common femoral artery, superficial femoral artery and deep  femoral artery are patent on the left. There is a 1.3 cm node or possible varix near the adductor canal on the left side.  The left popliteal artery is patent.  The left anterior and posterior tibial arteries appear to be patent on the left side.  Limited evaluation of the dorsalis pedis artery.  The proximal peroneal artery appears to be patent.  The tibial veins, popliteal vein and left femoral vein appear to be patent.  No evidence for left lower extremity DVT.  There is extensive subcutaneous edema in the left lower extremity.  There is bilateral knee joint fluid.  Nonvascular structures:  There is fluid within the scrotum, right side greater than left.  Findings are suggestive for hydroceles. There is volume loss in the lower lung lobes, left side greater than right.  No evidence for free intraperitoneal air.  Evaluation of the solid organs is limited due to the arterial phase contrast and patient's body habitus.  No gross abnormality to the  liver, gallbladder, spleen and right adrenal gland.  There is a 2.9 cm low density nodule involving the medial limb of the left adrenal gland. This is indeterminate on this postcontrast examination.  No gross abnormality to the kidneys.  The tail of the pancreas is nodular and there is concern for a lesion or mass in this location.  This area roughly measures 3.2 x 2.6 cm.  This is best seen on sequence #10, image 73.  There are small lymph nodes throughout the pelvis which are nonspecific.  No significant free fluid.  There is a ventral hernia containing fat.  Multilevel degenerative changes in the spine.   Review of the MIP images confirms the above findings.  IMPRESSION: Limited examination due to the patient's body habitus.  Limited evaluation of the abdominal aorta but no significant outflow or runoff disease.  There is no evidence for deep vein thrombosis in the lower legs.  There is subcutaneous edema in both lower legs, left side greater than right.  There is concern for a pancreatic tail lesion and a neoplastic process cannot be excluded.  There is also a nodule involving the left adrenal gland which is indeterminate.   Due to the patient's body habitus, additional CT imaging in this area may not be helpful.  Consider further evaluation of the pancreas and left adrenal gland with an open magnet MRI.  These results will be called to the ordering clinician or representative by the Radiologist Assistant, and communication documented in the PACS Dashboard.   Original Report Authenticated By: Richarda Overlie, M.D.    Dg Chest Portable 1 View  07/14/2012  *RADIOLOGY REPORT*  Clinical Data: Near-syncope.  PORTABLE CHEST - 1 VIEW  Comparison: 05/26/2012.  Findings: Trachea is midline.  Heart size is grossly stable. Defibrillator pads project over the left chest.  Lungs are grossly clear.  No right pleural effusion.  Left costophrenic angle is not included on the image.  IMPRESSION: No definite acute findings.    Original Report Authenticated By: Leanna Battles, M.D.    ASSESSMENT:  1. Sick sinus syndrome with intermittent heart block Mobitz I and II s/p PPM  2. Syncope secondary to #1  3. HTN  4. DM  5.  Mobility issues -he uses a walker to ambulate at home PLAN:  1. Await PT consult to help with ambulation issues - patient uses walker at home to ambulate and has not used his walker here yet due to PPM.  Discussed with Dr. Graciela Husbands and recommend  getting PT involved to assess type of walker that may benefit patient given new PPM placement and bearing weight on right arm.  Hopefully home later today   Quintella Reichert, MD  07/17/2012  9:37 AM

## 2012-07-25 ENCOUNTER — Encounter: Payer: Self-pay | Admitting: Internal Medicine

## 2012-07-25 ENCOUNTER — Ambulatory Visit (INDEPENDENT_AMBULATORY_CARE_PROVIDER_SITE_OTHER): Payer: Medicare Other | Admitting: *Deleted

## 2012-07-25 DIAGNOSIS — I440 Atrioventricular block, first degree: Secondary | ICD-10-CM

## 2012-07-25 DIAGNOSIS — I442 Atrioventricular block, complete: Secondary | ICD-10-CM

## 2012-07-25 LAB — PACEMAKER DEVICE OBSERVATION
AL THRESHOLD: 0.75 V
ATRIAL PACING PM: 6
BATTERY VOLTAGE: 2.8 V
RV LEAD THRESHOLD: 0.5 V
VENTRICULAR PACING PM: 100

## 2012-07-25 MED ORDER — FUROSEMIDE 20 MG PO TABS: 20.0000 mg | ORAL_TABLET | ORAL | Status: AC

## 2012-07-25 NOTE — Progress Notes (Signed)
Wound check pacer in clinic  

## 2012-08-15 ENCOUNTER — Telehealth: Payer: Self-pay | Admitting: Internal Medicine

## 2012-08-15 NOTE — Telephone Encounter (Signed)
Pt has questions re pace maker placement

## 2012-08-15 NOTE — Telephone Encounter (Signed)
  Spoke with patient and he states his steri strips were not removed at post hospital device check follow up.  I asked him if he would like to come in tomorrow to have them removed.  He states he will have to call back as he can't drive and does not know if he can get a ride.  He will call back and let someone in the device clinic know

## 2012-08-16 NOTE — Telephone Encounter (Signed)
N/A. Voicemail not set up so could not leave message/kwm

## 2012-08-20 ENCOUNTER — Other Ambulatory Visit: Payer: Self-pay

## 2012-08-22 NOTE — Telephone Encounter (Signed)
Strips were removed. Normal site with no redness or swelling. Pt scheduled for followup 10-28-12 with [facility].

## 2012-10-28 ENCOUNTER — Ambulatory Visit (INDEPENDENT_AMBULATORY_CARE_PROVIDER_SITE_OTHER): Payer: Medicare Other | Admitting: Internal Medicine

## 2012-10-28 ENCOUNTER — Encounter: Payer: Self-pay | Admitting: Internal Medicine

## 2012-10-28 VITALS — BP 160/79 | HR 81 | Ht 69.0 in | Wt 285.8 lb

## 2012-10-28 DIAGNOSIS — I442 Atrioventricular block, complete: Secondary | ICD-10-CM

## 2012-10-28 DIAGNOSIS — Z950 Presence of cardiac pacemaker: Secondary | ICD-10-CM

## 2012-10-28 DIAGNOSIS — I440 Atrioventricular block, first degree: Secondary | ICD-10-CM

## 2012-10-28 NOTE — Patient Instructions (Addendum)
Your physician wants you to follow-up in: Jan with Dr Court Joy will receive a reminder letter in the mail two months in advance. If you don't receive a letter, please call our office to schedule the follow-up appointment.

## 2012-10-28 NOTE — Progress Notes (Signed)
HPI Luis Carrillo returns today for followup. He is a very pleasant 79 year old man with complete heart block, hypertension, and COPD, status post permanent pacemaker insertion. In the interim, he has done well. He denies chest pain, shortness of breath, or syncope. He has chronic peripheral edema, and wears support stockings. Allergies  Allergen Reactions  . Alka-Seltzer Heartburn (Sodium Bicarbonate-Citric Acid)   . Citric Acid     All the ingredients in alka seltzer gold  . Potassium Bicarbonate     All the ingredients in alka seltzer gold  . Sodium Bicarbonate     All the ingredients in alka seltzer gold     Current Outpatient Prescriptions  Medication Sig Dispense Refill  . albuterol (PROVENTIL HFA;VENTOLIN HFA) 108 (90 BASE) MCG/ACT inhaler Inhale 4 puffs into the lungs every 2 (two) hours as needed for wheezing.      Marland Kitchen amLODipine (NORVASC) 5 MG tablet Take 5 mg by mouth daily.      . furosemide (LASIX) 20 MG tablet Take 1 tablet (20 mg total) by mouth every Monday, Wednesday, and Friday.  30 tablet  3  . glipiZIDE (GLUCOTROL XL) 10 MG 24 hr tablet Take 10 mg by mouth 2 (two) times daily.      . meloxicam (MOBIC) 15 MG tablet Take 15 mg by mouth daily.      . metFORMIN (GLUCOPHAGE) 500 MG tablet Take 500 mg by mouth 2 (two) times daily with a meal.      . Multiple Vitamin (MULTIVITAMIN WITH MINERALS) TABS Take 1 tablet by mouth daily.      Marland Kitchen oxyCODONE-acetaminophen (PERCOCET/ROXICET) 5-325 MG per tablet Take 1 tablet by mouth every 4 (four) hours as needed. For pain      . potassium chloride (K-DUR,KLOR-CON) 10 MEQ tablet Take 10 mEq by mouth 2 (two) times daily.      . traMADol (ULTRAM) 50 MG tablet Take 50 mg by mouth every 6 (six) hours as needed for pain.      . vitamin E 400 UNIT capsule Take 400 Units by mouth daily.      Marland Kitchen zolpidem (AMBIEN) 5 MG tablet Take 1 tablet (5 mg total) by mouth at bedtime as needed for sleep.  30 tablet  0   No current facility-administered medications  for this visit.     Past Medical History  Diagnosis Date  . Diabetes mellitus without complication   . COPD (chronic obstructive pulmonary disease)   . Hypertension     ROS:   All systems reviewed and negative except as noted in the HPI.   History reviewed. No pertinent past surgical history.   No family history on file.   History   Social History  . Marital Status: Married    Spouse Name: N/A    Number of Children: N/A  . Years of Education: N/A   Occupational History  . Not on file.   Social History Main Topics  . Smoking status: Former Smoker -- 1.00 packs/day for 20 years    Types: Cigarettes    Quit date: 05/24/1969  . Smokeless tobacco: Never Used  . Alcohol Use:   . Drug Use:   . Sexually Active:    Other Topics Concern  . 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      BP 160/79  Pulse 81  Ht 5\' 9"  (1.753 m)  Wt 285 lb 12.8 oz (129.638 kg)  BMI 42.19 kg/m2  Physical Exam:  Well appearing 79 year old man,NAD HEENT: Unremarkable Neck:  7 cm JVD, no thyromegally Back:  No CVA tenderness Lungs:  Clear with no wheezes, rales, or rhonchi. HEART:  Regular rate rhythm, no murmurs, no rubs, no clicks Abd:  soft, obese,positive bowel sounds, no organomegally, no rebound, no guarding Ext:  2 plus pulses, no edema, no cyanosis, no clubbing, support stockings in place up to the knee. Skin:  No rashes no nodules Neuro:  CN II through XII intact, motor grossly intact  EKG - normal sinus rhythm with P. Synchronous ventricular pacing.  DEVICE  Normal device function.  See PaceArt for details.   Assess/Plan:

## 2012-10-28 NOTE — Assessment & Plan Note (Signed)
His Medtronic dual-chamber pacemaker is working normally. He has approximately 11 years of battery longevity. He has underlying complete heart block.

## 2012-10-28 NOTE — Assessment & Plan Note (Signed)
He is encouraged to lose weight and maintain a low-fat diet. Also, he is encouraged to increase his physical activity.

## 2013-02-08 ENCOUNTER — Other Ambulatory Visit: Payer: Self-pay

## 2013-03-29 IMAGING — CR DG CHEST 1V PORT
1 series · 1 of 1 positions shown · non-contrast
Comparison: 05/24/2012

CLINICAL DATA: Respiratory failure

PORTABLE CHEST - 1 VIEW

[AP]
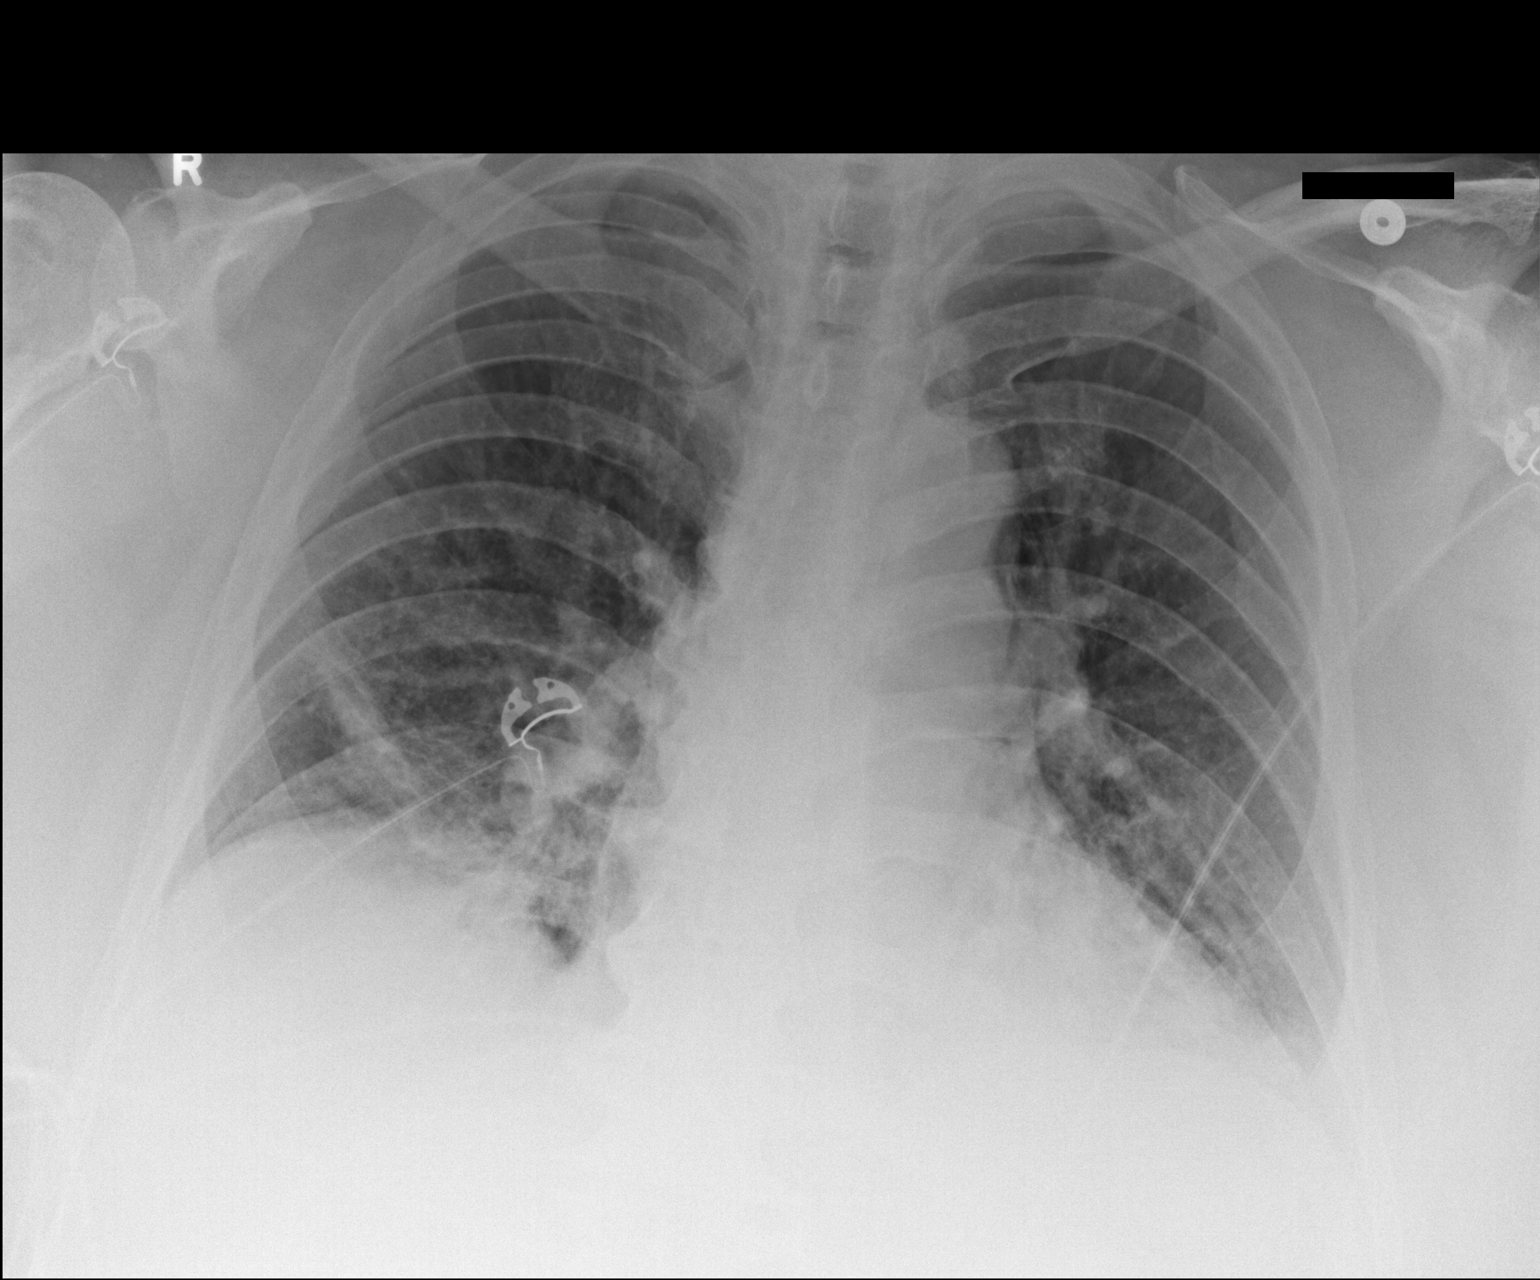

[1 of 1 positions shown; findings below may reference images not displayed]

FINDINGS: Bibasilar patchy opacities are worse.  Low volumes.
Borderline cardiomegaly.  No pneumothorax.
IMPRESSION: Bibasilar atelectasis verses airspace disease is worse.

## 2013-05-11 ENCOUNTER — Other Ambulatory Visit: Payer: Self-pay

## 2013-05-17 IMAGING — CR DG CHEST 1V PORT
2 series · 2 of 2 positions shown · non-contrast
Comparison: 05/26/2012.

CLINICAL DATA: Near-syncope.

PORTABLE CHEST - 1 VIEW

[AP (1 of 2)]
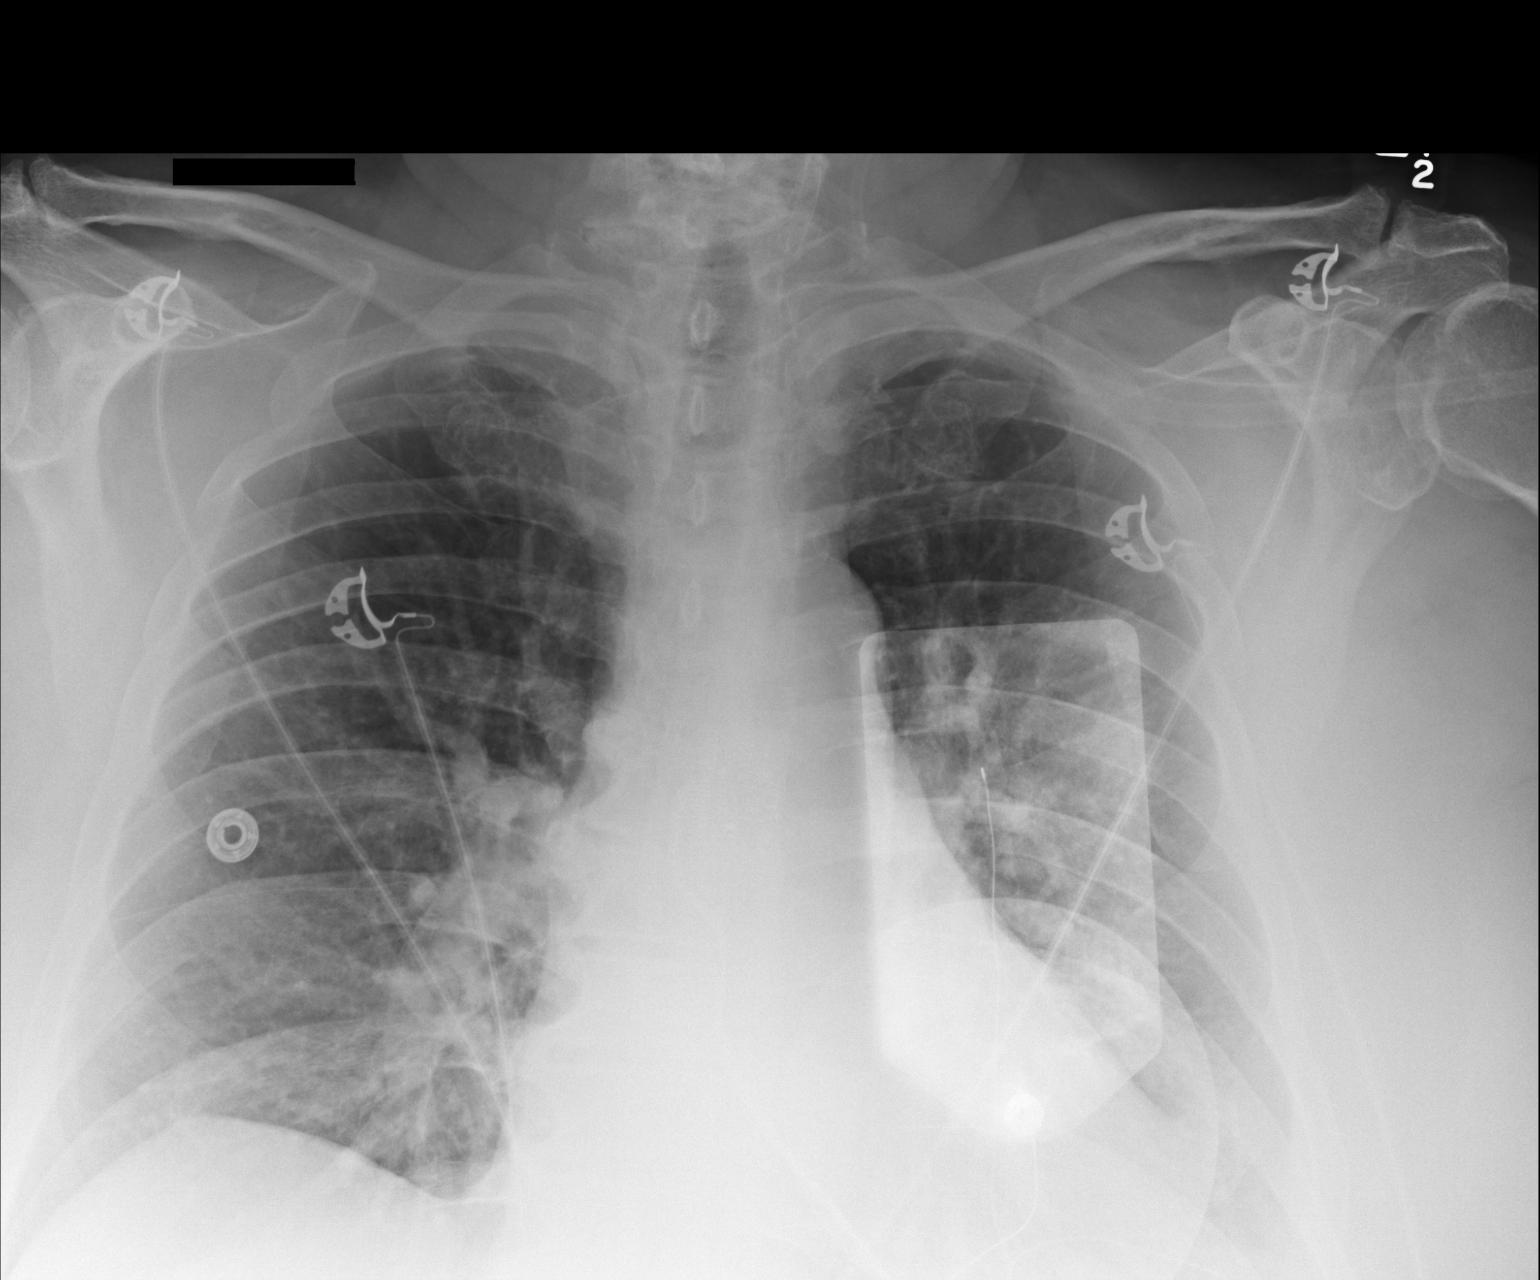

[AP (2 of 2)]
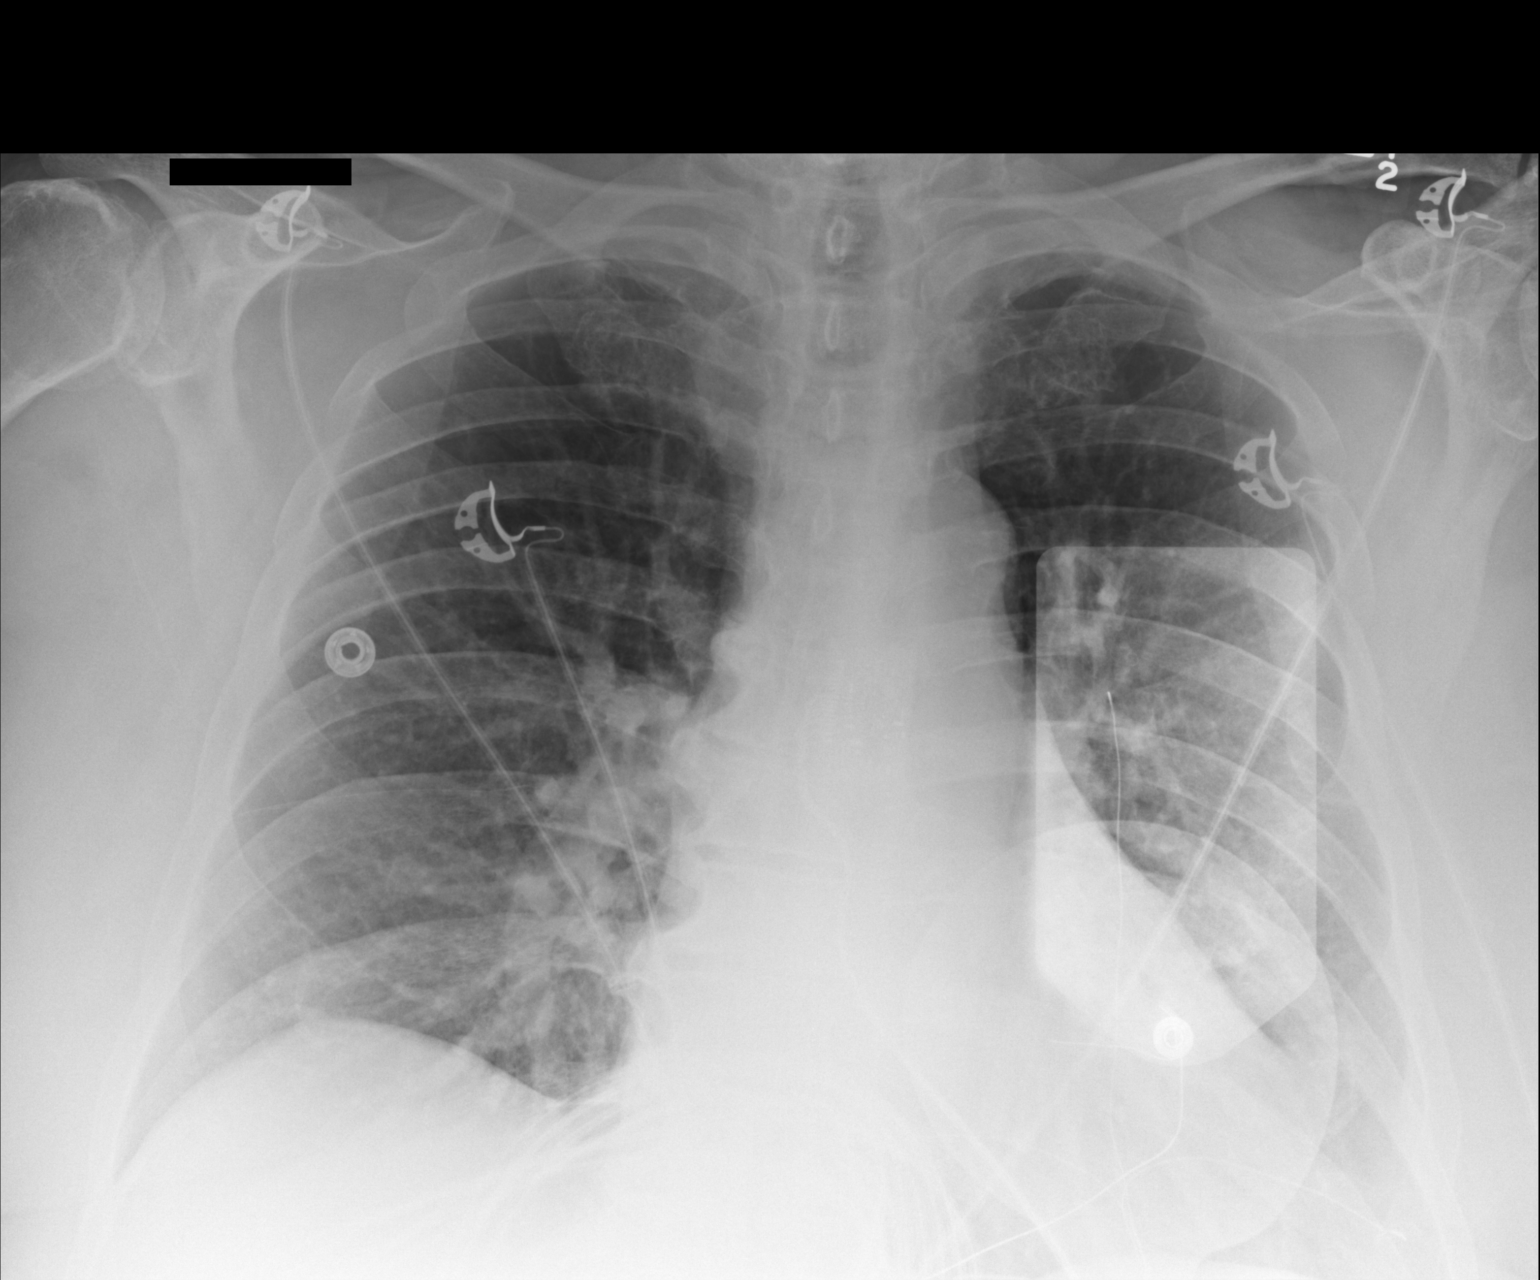

[2 of 2 positions shown; findings below may reference images not displayed]

FINDINGS: Trachea is midline.  Heart size is grossly stable.
Defibrillator pads project over the left chest.  Lungs are grossly
clear.  No right pleural effusion.  Left costophrenic angle is not
included on the image.
IMPRESSION: No definite acute findings.

## 2014-06-14 ENCOUNTER — Encounter (HOSPITAL_COMMUNITY): Payer: Self-pay | Admitting: Internal Medicine

## 2014-09-05 ENCOUNTER — Encounter: Payer: Self-pay | Admitting: *Deleted

## 2016-06-09 ENCOUNTER — Other Ambulatory Visit (HOSPITAL_COMMUNITY): Payer: Self-pay | Admitting: *Deleted

## 2016-06-09 NOTE — Patient Instructions (Signed)
Luis Carrillo  06/09/2016   Your procedure is scheduled on12-13-17:   Report to [facility] Main  Entrance take [facility]  elevators to 3rd floor to  Short Stay Center at  930AM.  Call this number if you have problems the morning of surgery [phone]   Remember: ONLY 1 PERSON MAY GO WITH YOU TO SHORT STAY TO GET  READY MORNING OF YOUR SURGERY.  Do not eat food or drink liquids :After Midnight.     Take these medicines the morning of surgery with A SIP OF WATER:  DO NOT TAKE ANY DIABETIC MEDICATIONS DAY OF YOUR SURGERY                               You may not have any metal on your body including hair pins and              piercings  Do not wear jewelry, make-up, lotions, powders or perfumes, deodorant             Do not wear nail polish.  Do not shave  48 hours prior to surgery.              Men may shave face and neck.   Do not bring valuables to the hospital. [facility] IS NOT             RESPONSIBLE   FOR VALUABLES.  Contacts, dentures or bridgework may not be worn into surgery.  Leave suitcase in the car. After surgery it may be brought to your room.                  Please read over the following fact sheets you were given: _____________________________________________________________________             How to Manage Your Diabetes Before and After Surgery  Why is it important to control my blood sugar before and after surgery? . Improving blood sugar levels before and after surgery helps healing and can limit problems. . A way of improving blood sugar control is eating a healthy diet by: o  Eating less sugar and carbohydrates o  Increasing activity/exercise o  Talking with your doctor about reaching your blood sugar goals . High blood sugars (greater than 180 mg/dL) can raise your risk of infections and slow your recovery, so you will need to focus on controlling your diabetes during the weeks before surgery. . Make sure that the doctor who takes  care of your diabetes knows about your planned surgery including the date and location.  How do I manage my blood sugar before surgery? . Check your blood sugar at least 4 times a day, starting 2 days before surgery, to make sure that the level is not too high or low. o Check your blood sugar the morning of your surgery when you wake up and every 2 hours until you get to the Short Stay unit. . If your blood sugar is less than 70 mg/dL, you will need to treat for low blood sugar: o Do not take insulin. o Treat a low blood sugar (less than 70 mg/dL) with  cup of clear juice (cranberry or apple), 4 glucose tablets, OR glucose gel. o Recheck blood sugar in 15 minutes after treatment (to make sure it is greater than 70 mg/dL). If your blood sugar is not greater  than 70 mg/dL on recheck, call [phone] for further instructions. . Report your blood sugar to the short stay nurse when you get to Short Stay.  . If you are admitted to the hospital after surgery: o Your blood sugar will be checked by the staff and you will probably be given insulin after surgery (instead of oral diabetes medicines) to make sure you have good blood sugar levels. o The goal for blood sugar control after surgery is 80-180 mg/dL.   WHAT DO I DO ABOUT MY DIABETES MEDICATION?  Marland Kitchen. Do not take oral diabetes medicines (pills) the morning of surgery.  . THE NIGHT BEFORE SURGERY, take ___________ units of ___________insulin.       . THE MORNING OF SURGERY, take _____________ units of __________insulin.  . The day of surgery, do not take other diabetes injectables, including Byetta (exenatide), Bydureon (exenatide ER), Victoza (liraglutide), or Trulicity (dulaglutide).  . If your CBG is greater than 220 mg/dL, you may take  of your sliding scale  . (correction) dose of insulin.     Patient Signature:  Date:   Nurse Signature:  Date:   Reviewed and Endorsed by [facility], August 2015[facility] - Preparing [facility] Before surgery, you can play an important role.  Because skin is not sterile, your skin needs to be as free of germs as possible.  You can reduce the number of germs on your skin by washing with CHG (chlorahexidine gluconate) soap before surgery.  CHG is an antiseptic cleaner which kills germs and bonds with the skin to continue killing germs even after washing. Please DO NOT use if you have an allergy to CHG or antibacterial soaps.  If your skin becomes reddened/irritated stop using the CHG and inform your nurse when you arrive at Short Stay. Do not shave (including legs and underarms) for at least 48 hours prior to the first CHG shower.  You may shave your face/neck. Please follow these instructions carefully:  1.  Shower with CHG Soap the night before surgery and the  morning of Surgery.  2.  If you choose to wash your hair, wash your hair first as usual with your  normal  shampoo.  3.  After you shampoo, rinse your hair and body thoroughly to remove the  shampoo.                           4.  Use CHG as you would any other liquid soap.  You can apply chg directly  to the skin and wash                       Gently with a scrungie or clean washcloth.  5.  Apply the CHG Soap to your body ONLY FROM THE NECK DOWN.   Do not use on face/ open                           Wound or open sores. Avoid contact with eyes, ears mouth and genitals (private parts).                       Wash face,  Genitals (private parts) with your normal soap.             6.  Wash thoroughly, paying special attention to the area where your surgery  will be performed.  7.  Thoroughly rinse your body with warm water from the neck down.  8.  DO NOT shower/wash with your normal soap after using and rinsing off  the CHG Soap.                9.  Pat yourself dry with a clean towel.            10.  Wear clean pajamas.            11.  Place clean sheets on your bed the night of your first shower and do not   sleep with pets. Day of Surgery : Do not apply any lotions/deodorants the morning of surgery.  Please wear clean clothes to the hospital/surgery center.  Incentive Spirometer  An incentive spirometer is a tool that can help keep your lungs clear and active. This tool measures how well you are filling your lungs with each breath. Taking long deep breaths may help reverse or decrease the chance of developing breathing (pulmonary) problems (especially infection) following:  A long period of time when you are unable to move or be active. BEFORE THE PROCEDURE   If the spirometer includes an indicator to show your best effort, your nurse or respiratory therapist will set it to a desired goal.  If possible, sit up straight or lean slightly forward. Try not to slouch.  Hold the incentive spirometer in an upright position. INSTRUCTIONS FOR USE  1. Sit on the edge of your bed if possible, or sit up as far as you can in bed or on a chair. 2. Hold the incentive spirometer in an upright position. 3. Breathe out normally. 4. Place the mouthpiece in your mouth and seal your lips tightly around it. 5. Breathe in slowly and as deeply as possible, raising the piston or the ball toward the top of the column. 6. Hold your breath for 3-5 seconds or for as long as possible. Allow the piston or ball to fall to the bottom of the column. 7. Remove the mouthpiece from your mouth and breathe out normally. 8. Rest for a few seconds and repeat Steps 1 through 7 at least 10 times every 1-2 hours when you are awake. Take your time and take a few normal breaths between deep breaths. 9. The spirometer may include an indicator to show your best effort. Use the indicator as a goal to work toward during each repetition. 10. After each set of 10 deep breaths, practice coughing to be sure your lungs are clear. If you have an incision (the cut made at the time of surgery), support your incision when coughing by placing a pillow  or rolled up towels firmly against it. Once you are able to get out of bed, walk around indoors and cough well. You may stop using the incentive spirometer when instructed by your caregiver.  RISKS AND COMPLICATIONS  Take your time so you do not get dizzy or light-headed.  If you are in pain, you may need to take or ask for pain medication before doing incentive spirometry. It is harder to take a deep breath if you are having pain. AFTER USE  Rest and breathe slowly and easily.  It can be helpful to keep track of a log of your progress. Your caregiver can provide you with a simple table to help with this. If you are using the spirometer at home, follow these instructions: SEEK MEDICAL CARE IF:   You are having difficultly using the spirometer.  You have trouble using the spirometer as often as instructed.  Your pain medication is not giving enough relief while using the spirometer.  You develop fever of 100.5 F (38.1 C) or higher. SEEK IMMEDIATE MEDICAL CARE IF:   You cough up bloody sputum that had not been present before.  You develop fever of 102 F (38.9 C) or greater.  You develop worsening pain at or near the incision site. MAKE SURE YOU:   Understand these instructions.  Will watch your condition.  Will get help right away if you are not doing well or get worse. Document Released: 11/02/2006 Document Revised: 09/14/2011 Document Reviewed: 01/03/2007 [employer] Patient Information 2014 [employer], [place].   ________________________________________________________________________  WHAT IS A BLOOD TRANSFUSION? Blood Transfusion Information  A transfusion is the replacement of blood or some of its parts. Blood is made up of multiple cells which provide different functions.  Red blood cells carry oxygen and are used for blood loss replacement.  White blood cells fight against infection.  Platelets control bleeding.  Plasma helps clot blood.  Other blood products  are available for specialized needs, such as hemophilia or other clotting disorders. BEFORE THE TRANSFUSION  Who gives blood for transfusions?   Healthy volunteers who are fully evaluated to make sure their blood is safe. This is blood bank blood. Transfusion therapy is the safest it has ever been in the practice of medicine. Before blood is taken from a donor, a complete history is taken to make sure that person has no history of diseases nor engages in risky social behavior (examples are intravenous drug use or sexual activity with multiple partners). The donor's travel history is screened to minimize risk of transmitting infections, such as malaria. The donated blood is tested for signs of infectious diseases, such as HIV and hepatitis. The blood is then tested to be sure it is compatible with you in order to minimize the chance of a transfusion reaction. If you or a relative donates blood, this is often done in anticipation of surgery and is not appropriate for emergency situations. It takes many days to process the donated blood. RISKS AND COMPLICATIONS Although transfusion therapy is very safe and saves many lives, the main dangers of transfusion include:   Getting an infectious disease.  Developing a transfusion reaction. This is an allergic reaction to something in the blood you were given. Every precaution is taken to prevent this. The decision to have a blood transfusion has been considered carefully by your caregiver before blood is given. Blood is not given unless the benefits outweigh the risks. AFTER THE TRANSFUSION  Right after receiving a blood transfusion, you will usually feel much better and more energetic. This is especially true if your red blood cells have gotten low (anemic). The transfusion raises the level of the red blood cells which carry oxygen, and this usually causes an energy increase.  The nurse administering the transfusion will monitor you carefully for  complications. HOME CARE INSTRUCTIONS  No special instructions are needed after a transfusion. You may find your energy is better. Speak with your caregiver about any limitations on activity for underlying diseases you may have. SEEK MEDICAL CARE IF:   Your condition is not improving after your transfusion.  You develop redness or irritation at the intravenous (IV) site. SEEK IMMEDIATE MEDICAL CARE IF:  Any of the following symptoms occur over the next 12 hours:  Shaking chills.  You have a temperature by mouth above 102 F (38.9 C), not  controlled by medicine.  Chest, back, or muscle pain.  People around you feel you are not acting correctly or are confused.  Shortness of breath or difficulty breathing.  Dizziness and fainting.  You get a rash or develop hives.  You have a decrease in urine output.  Your urine turns a dark color or changes to pink, red, or brown. Any of the following symptoms occur over the next 10 days:  You have a temperature by mouth above 102 F (38.9 C), not controlled by medicine.  Shortness of breath.  Weakness after normal activity.  The white part of the eye turns yellow (jaundice).  You have a decrease in the amount of urine or are urinating less often.  Your urine turns a dark color or changes to pink, red, or brown. Document Released: 06/19/2000 Document Revised: 09/14/2011 Document Reviewed: 02/06/2008 [facility] Patient Information 2014 [place], [place].  _______________________________________________________________________

## 2016-06-12 ENCOUNTER — Encounter (HOSPITAL_COMMUNITY)
Admission: RE | Admit: 2016-06-12 | Discharge: 2016-06-12 | Disposition: A | Discharge status: Home / Self Care | Payer: Self-pay | Source: Ambulatory Visit | Attending: Orthopedic Surgery | Admitting: Orthopedic Surgery

## 2016-06-15 ENCOUNTER — Inpatient Hospital Stay (HOSPITAL_COMMUNITY): Admission: RE | Admit: 2016-06-15 | Payer: Medicare Other | Source: Ambulatory Visit

## 2016-06-17 ENCOUNTER — Encounter (HOSPITAL_COMMUNITY): Admission: RE | Payer: Self-pay | Source: Ambulatory Visit

## 2016-06-17 ENCOUNTER — Inpatient Hospital Stay (HOSPITAL_COMMUNITY): Admission: RE | Admit: 2016-06-17 | Payer: Medicare Other | Source: Ambulatory Visit | Admitting: Orthopedic Surgery

## 2016-06-17 SURGERY — ARTHROPLASTY, KNEE, TOTAL
Anesthesia: General | Site: Knee | Laterality: Right

## 2016-07-06 DEATH — deceased
